# Patient Record
Sex: Female | Born: 1965 | Race: White | Hispanic: No | Marital: Married | Smoking: Current every day smoker
Health system: Southern US, Community
[De-identification: ages and names within clinical notes are randomized; demographics above are authoritative.]

## PROBLEM LIST (undated history)

## (undated) DIAGNOSIS — N644 Mastodynia: Principal | ICD-10-CM

## (undated) DIAGNOSIS — F640 Transsexualism: Secondary | ICD-10-CM

## (undated) DIAGNOSIS — F32A Depression, unspecified: Secondary | ICD-10-CM

## (undated) DIAGNOSIS — F329 Major depressive disorder, single episode, unspecified: Secondary | ICD-10-CM

## (undated) DIAGNOSIS — Z7289 Other problems related to lifestyle: Secondary | ICD-10-CM

## (undated) DIAGNOSIS — Z789 Other specified health status: Secondary | ICD-10-CM

## (undated) DIAGNOSIS — Z8781 Personal history of (healed) traumatic fracture: Secondary | ICD-10-CM

## (undated) DIAGNOSIS — G931 Anoxic brain damage, not elsewhere classified: Secondary | ICD-10-CM

## (undated) DIAGNOSIS — F419 Anxiety disorder, unspecified: Secondary | ICD-10-CM

## (undated) DIAGNOSIS — F319 Bipolar disorder, unspecified: Secondary | ICD-10-CM

## (undated) HISTORY — DX: Personal history of (healed) traumatic fracture: Z87.81

## (undated) HISTORY — DX: Depression, unspecified: F32.A

## (undated) HISTORY — DX: Mastodynia: N64.4

## (undated) HISTORY — PX: TONSILLECTOMY: SUR1361

## (undated) HISTORY — DX: Transsexualism: F64.0

## (undated) HISTORY — DX: Bipolar disorder, unspecified: F31.9

## (undated) HISTORY — DX: Major depressive disorder, single episode, unspecified: F32.9

## (undated) HISTORY — DX: Other problems related to lifestyle: Z72.89

## (undated) HISTORY — PX: MYRINGOTOMY: SUR874

## (undated) HISTORY — DX: Other specified health status: Z78.9

## (undated) HISTORY — PX: BREAST SURGERY: SHX581

## (undated) HISTORY — DX: Anxiety disorder, unspecified: F41.9

## (undated) HISTORY — DX: Anoxic brain damage, not elsewhere classified: G93.1

---
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
## 2011-10-03 DIAGNOSIS — Z8781 Personal history of (healed) traumatic fracture: Secondary | ICD-10-CM

## 2011-10-03 HISTORY — DX: Personal history of (healed) traumatic fracture: Z87.81

## 2014-12-16 ENCOUNTER — Telehealth: Payer: Self-pay | Admitting: Family Medicine

## 2014-12-16 NOTE — Telephone Encounter (Signed)
Called patient and set appt up with Dr Clelia CroftShaw per Dr Clelia CroftShaw

## 2015-01-12 ENCOUNTER — Ambulatory Visit (INDEPENDENT_AMBULATORY_CARE_PROVIDER_SITE_OTHER): Payer: PPO | Admitting: Physician Assistant

## 2015-01-12 ENCOUNTER — Encounter: Payer: Self-pay | Admitting: Family Medicine

## 2015-01-12 VITALS — BP 114/78 | HR 91 | Temp 98.1°F | Resp 16 | Ht 69.0 in | Wt 209.2 lb

## 2015-01-12 DIAGNOSIS — H9313 Tinnitus, bilateral: Secondary | ICD-10-CM

## 2015-01-12 DIAGNOSIS — Z79890 Hormone replacement therapy (postmenopausal): Secondary | ICD-10-CM

## 2015-01-12 DIAGNOSIS — K219 Gastro-esophageal reflux disease without esophagitis: Secondary | ICD-10-CM | POA: Diagnosis not present

## 2015-01-12 DIAGNOSIS — I10 Essential (primary) hypertension: Secondary | ICD-10-CM | POA: Diagnosis not present

## 2015-01-12 DIAGNOSIS — Z6829 Body mass index (BMI) 29.0-29.9, adult: Secondary | ICD-10-CM | POA: Insufficient documentation

## 2015-01-12 DIAGNOSIS — K08109 Complete loss of teeth, unspecified cause, unspecified class: Secondary | ICD-10-CM

## 2015-01-12 DIAGNOSIS — K000 Anodontia: Secondary | ICD-10-CM | POA: Diagnosis not present

## 2015-01-12 DIAGNOSIS — Z1211 Encounter for screening for malignant neoplasm of colon: Secondary | ICD-10-CM

## 2015-01-12 DIAGNOSIS — F17200 Nicotine dependence, unspecified, uncomplicated: Secondary | ICD-10-CM

## 2015-01-12 DIAGNOSIS — G931 Anoxic brain damage, not elsewhere classified: Secondary | ICD-10-CM | POA: Insufficient documentation

## 2015-01-12 DIAGNOSIS — F419 Anxiety disorder, unspecified: Secondary | ICD-10-CM | POA: Diagnosis not present

## 2015-01-12 DIAGNOSIS — N644 Mastodynia: Secondary | ICD-10-CM

## 2015-01-12 DIAGNOSIS — Z6830 Body mass index (BMI) 30.0-30.9, adult: Secondary | ICD-10-CM | POA: Diagnosis not present

## 2015-01-12 DIAGNOSIS — F640 Transsexualism: Secondary | ICD-10-CM | POA: Insufficient documentation

## 2015-01-12 DIAGNOSIS — Z720 Tobacco use: Secondary | ICD-10-CM

## 2015-01-12 DIAGNOSIS — Z7689 Persons encountering health services in other specified circumstances: Secondary | ICD-10-CM

## 2015-01-12 DIAGNOSIS — Z7189 Other specified counseling: Secondary | ICD-10-CM

## 2015-01-12 DIAGNOSIS — F319 Bipolar disorder, unspecified: Secondary | ICD-10-CM | POA: Diagnosis not present

## 2015-01-12 DIAGNOSIS — H9319 Tinnitus, unspecified ear: Secondary | ICD-10-CM | POA: Insufficient documentation

## 2015-01-12 HISTORY — DX: Mastodynia: N64.4

## 2015-01-12 MED ORDER — SPIRONOLACTONE 100 MG PO TABS: 100.0000 mg | ORAL_TABLET | Freq: Two times a day (BID) | ORAL | Status: DC

## 2015-01-12 MED ORDER — LISINOPRIL 10 MG PO TABS: 10.0000 mg | ORAL_TABLET | Freq: Every day | ORAL | Status: DC

## 2015-01-12 MED ORDER — SERTRALINE HCL 100 MG PO TABS: 100.0000 mg | ORAL_TABLET | Freq: Two times a day (BID) | ORAL | Status: DC

## 2015-01-12 MED ORDER — OMEPRAZOLE 40 MG PO CPDR: 40.0000 mg | DELAYED_RELEASE_CAPSULE | Freq: Every day | ORAL | Status: DC

## 2015-01-12 MED ORDER — "SYRINGE 22G X 1-1/2"" 3 ML MISC": Status: DC

## 2015-01-12 MED ORDER — ALPRAZOLAM 2 MG PO TABS: 2.0000 mg | ORAL_TABLET | Freq: Every evening | ORAL | Status: DC | PRN

## 2015-01-12 MED ORDER — ESTRADIOL CYPIONATE 5 MG/ML IM OIL: TOPICAL_OIL | INTRAMUSCULAR | Status: DC

## 2015-01-12 NOTE — Progress Notes (Signed)
Subjective:   Patient ID: Anita Sparks, female     DOB: 03/31/1966, 49 y.o.    MRN: [id number]  PCP: No primary care provider on file.  Chief Complaint  Patient presents with  . esablish care    with Dr. Clelia Croft  . Hernia  . Breast Pain    HPI  Presents to establish care and discuss several issues. She also needs all her medications refilled.  Previously followed by a provider in [place]. Her Engineer, petroleum is in [place]. Her PCP was managing her hormones in consultation with her previous provider in [place], and expert in transgender medicine.  Because Dr. Clelia Croft knows this patient socially, I agreed to see this patient in her place.  She is having LEFT breast pain. She underwent breast augmentation as the first surgery in her transition from female to female. The LEFT implant has developed a scarring "capsule" causing deformity and pain. The surgeon prescribed a three month course of zafirlukast and massage. If ineffective, she understands that surgical repair is the only option. In the two months on this regimen, she's had no improvement.  Prior to Admission medications   Medication Sig Start Date End Date Taking? Authorizing Provider  alprazolam Prudy Feeler) 2 MG tablet Take 1 tablet (2 mg total) by mouth at bedtime as needed for sleep. 01/12/15  Yes Nannette Zill, PA-C  aspirin 81 MG tablet Take 81 mg by mouth daily.   Yes Historical Provider, MD  estradiol cypionate (DEPO-ESTRADIOL) 5 MG/ML injection 2.24 mL IM Q 13 days 01/12/15  Yes Alondra Sahni, PA-C  lisinopril (PRINIVIL,ZESTRIL) 10 MG tablet Take 1 tablet (10 mg total) by mouth daily. 01/12/15  Yes Vint Pola, PA-C  omeprazole (PRILOSEC) 40 MG capsule Take 1 capsule (40 mg total) by mouth daily. 01/12/15  Yes Dnaiel Voller, PA-C  sertraline (ZOLOFT) 100 MG tablet Take 1 tablet (100 mg total) by mouth 2 (two) times daily. 01/12/15  Yes Kavin Weckwerth, PA-C  spironolactone (ALDACTONE) 100 MG tablet Take 1 tablet (100 mg total) by mouth  2 (two) times daily. 01/12/15  Yes Erman Thum, PA-C  zafirlukast (ACCOLATE) 20 MG tablet Take 20 mg by mouth 2 (two) times daily before a meal.   Yes Historical Provider, MD  Syringe/Needle, Disp, (SYRINGE 3CC/22GX1-1/2") 22G X 1-1/2" 3 ML MISC Use as directed 01/12/15   Porfirio Oar, PA-C     Allergies  Allergen Reactions  . Wellbutrin [Bupropion] Palpitations     Patient Active Problem List   Diagnosis Date Noted  . Anoxic brain injury 01/12/2015  . 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 Benign essential HTN 01/12/2015  . GERD (gastroesophageal reflux disease) 01/12/2015  . Breast pain 01/12/2015  . Tinnitus 01/12/2015  . Edentulous 01/12/2015  . Bipolar 1 disorder      Family History  Problem Relation Age of Onset  . Heart disease Father      History   Social History  . Marital Status: Single    Spouse Name: n/a  . Number of Children: 2  . Years of Education: 11th grade   Occupational History  . disabled due to bipolar disorder    Social History Main Topics  . Smoking status: Current Every Day Smoker  . Smokeless tobacco: Not on file  . Alcohol Use: No  . Drug Use: No  . Sexual Activity: Not on file   Other Topics Concern  . Not on file   Social History Narrative   Most of family (mother, sons) disowned her when she began  transitioning from female to female, calling her a freak.   Father was [employer] in [place].   3rd degree [address], [place]        Review of Systems  Constitutional: Negative.   HENT: Positive for rhinorrhea.   Eyes: Negative for visual disturbance.  Respiratory: Positive for cough. Negative for shortness of breath.   Cardiovascular: Negative for chest pain and palpitations.  Gastrointestinal: Negative for nausea, vomiting, diarrhea, constipation and blood in stool.  Endocrine: Negative for cold intolerance, heat intolerance, polydipsia, polyphagia and polyuria.  Genitourinary: Negative for dysuria, urgency, frequency  and hematuria.  Musculoskeletal: Negative for myalgias, back pain and arthralgias.  Skin: Negative for rash and wound.  Allergic/Immunologic: Positive for environmental allergies.  Neurological: Negative for dizziness, weakness and headaches.  Psychiatric/Behavioral: Negative for self-injury and dysphoric mood. The patient is not nervous/anxious.          Objective:  Physical Exam  Constitutional: She appears well-developed and well-nourished. No distress.  BP 114/78 mmHg  Pulse 91  Temp(Src) 98.1 F (36.7 C) (Oral)  Resp 16  Ht  (1.753 m)  Wt 209 lb 3.2 oz (94.892 kg)  BMI 30.88 kg/m2  SpO2 95%   HENT:  Head: Normocephalic and atraumatic.  Right Ear: Hearing, tympanic membrane, external ear and ear canal normal.  Left Ear: Hearing, tympanic membrane, external ear and ear canal normal.  Nose: Mucosal edema present. No rhinorrhea. No epistaxis.  No foreign bodies.  Mouth/Throat: Uvula is midline, oropharynx is clear and moist and mucous membranes are normal. She does not have dentures. Abnormal dentition (fully edentulous). No oropharyngeal exudate.  Eyes: Conjunctivae, EOM and lids are normal. Pupils are equal, round, and reactive to light.  Neck: Full passive range of motion without pain and phonation normal. Neck supple. No thyroid mass and no thyromegaly present.  Cardiovascular: Normal rate, regular rhythm, normal heart sounds and normal pulses.   Pulmonary/Chest: Effort normal and breath sounds normal. Right breast exhibits no inverted nipple, no mass, no nipple discharge, no skin change and no tenderness. Left breast exhibits tenderness. Left breast exhibits no inverted nipple, no mass, no nipple discharge and no skin change. Breasts are asymmetrical.  Bilateral breast implants. LEFT breast is firm, tender, "tight." No erythema. No induration. Surgical scars are well healed.  Abdominal: Normal appearance and bowel sounds are normal. There is no hepatosplenomegaly. There  is no tenderness. Hernia confirmed negative in the ventral area.    Skin: Skin is warm and dry.  Psychiatric: Her speech is normal and behavior is normal. Her mood appears not anxious. Her affect is not angry, not blunt, not labile and not inappropriate. She does not exhibit a depressed mood. She expresses no homicidal and no suicidal ideation.             Assessment & Plan:  1. Encounter to establish care Consent signed for release of previous records. Schedule visit to update health maintenance items in 3 months.  2. Hormone replacement therapy (HRT) Continue current regimen. Will need hormone levels in 3 months. - spironolactone (ALDACTONE) 100 MG tablet; Take 1 tablet (100 mg total) by mouth 2 (two) times daily.  Dispense: 90 tablet; Refill: 3 - estradiol cypionate (DEPO-ESTRADIOL) 5 MG/ML injection; 2.24 mL IM Q 13 days  Dispense: 15 mL; Refill: 3 - Syringe/Needle, Disp, (SYRINGE 3CC/22GX1-1/2") 22G X 1-1/2" 3 ML MISC; Use as directed  Dispense: 50 each; Refill: 0  3. Bipolar 1 disorder Stable on her current regimen.  I will continue this treatment, but if changes are needed, I will refer to psychiatry. - sertraline (ZOLOFT) 100 MG tablet; Take 1 tablet (100 mg total) by mouth 2 (two) times daily.  Dispense: 90 tablet; Refill: 3  4. Anxiety See above. - sertraline (ZOLOFT) 100 MG tablet; Take 1 tablet (100 mg total) by mouth 2 (two) times daily.  Dispense: 90 tablet; Refill: 3 - alprazolam (XANAX) 2 MG tablet; Take 1 tablet (2 mg total) by mouth at bedtime as needed for sleep.  Dispense: 30 tablet; Refill: 0  5. Benign essential HTN Well controlled. Continue current treatment. - lisinopril (PRINIVIL,ZESTRIL) 10 MG tablet; Take 1 tablet (10 mg total) by mouth daily.  Dispense: 90 tablet; Refill: 3  6. Gastroesophageal reflux disease without esophagitis Controlled. Continue current treatment. - omeprazole (PRILOSEC) 40 MG capsule; Take 1 capsule (40 mg total) by mouth daily.   Dispense: 90 capsule; Refill: 3  7. Breast pain She will get the information from her breast surgeon regarding the follow up plan. She is going to need a letter of medical necessity from me in order to have coverage for any surgical intervention. I need the specific diagnosis (adhesive/fibrosing capsulitis?) and planned procedure.  8. Tinnitus, bilateral She'll let me know when she is ready for referral. Presently, she cannot afford specialty co-pays that aren't absolutely necessary.  9. Screening for colon cancer She refuses colonoscopy at present. - POC Hemoccult Bld/Stl (3-Cd Home Screen); Future  10. Edentulous  11. BMI 30.0-30.9,adult Recommend increased exercise and healthy eating choices.  12. Smoker Encouraged smoking cessation.   Fernande Bras, PA-C Physician Assistant-Certified [facility]

## 2015-01-14 ENCOUNTER — Telehealth: Payer: Self-pay | Admitting: Physician Assistant
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
## 2015-01-14 NOTE — Telephone Encounter (Signed)
FYI
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
## 2015-01-16 ENCOUNTER — Telehealth: Payer: Self-pay

## 2015-01-16 NOTE — Telephone Encounter (Signed)
Can we provide this?

## 2015-01-16 NOTE — Telephone Encounter (Signed)
Pt states she would like to have a letter from the Dr stating her cat is like a service animal for her due to her bipolar and it helps with her stress issues Please call [phone]

## 2015-01-17 NOTE — Telephone Encounter (Signed)
chelle- Do you want to advise on this?

## 2015-01-17 NOTE — Telephone Encounter (Signed)
Pt checking on status of this message. Please advise at [phone]

## 2015-01-17 NOTE — Telephone Encounter (Signed)
Pt states she hate to be impatient but her landlord is giving her a hard time, really need the note Please call [phone]

## 2015-01-18 NOTE — Telephone Encounter (Signed)
Unfortunately, it is out of our medical license/capacity to claim this patient's cat is a Metallurgist. Patient can wait to discuss this when Chelle returns from vacation or she can go to a psychiatrist. We cannot provide the referral to psychiatry, she must pursue this on her own. Thank you.

## 2015-01-18 NOTE — Telephone Encounter (Signed)
Patient wants Dr. Clelia Croft to know that the source of her?? Breast pain is called a breast capsule removal. She consulted with a plastic surgeon. She also states that she is bipolar and really needs a letter stating that her cat is a Metallurgist.   [phone]

## 2015-01-18 NOTE — Telephone Encounter (Signed)
I am not involved in this pt's care. Will send to pa pool as well as Chelle who see her may be on vacation.

## 2015-01-19 ENCOUNTER — Ambulatory Visit: Payer: Self-pay | Admitting: Family Medicine

## 2015-01-20 NOTE — Telephone Encounter (Signed)
Called pt, advised message from Dr. Clelia Croft. She was referring to [facility] not Clelia Croft. It sounded like she said Clelia Croft when she said it.

## 2015-01-22 NOTE — Telephone Encounter (Signed)
Does she have any documentation from previous providers regarding the relief provided by the cat?  If so, that would be helpful. If she has signed a lease that states that cats are not allowed, I'm not sure that even a letter from me will help, but I can try.  I need some specifics from her regarding the ways in which the cat helps her.  She was going to get me the details of the letter regarding the breast capsule removal (specificaly what it needs to say).

## 2015-01-23 NOTE — Telephone Encounter (Signed)
She states she will get this information and call me back.

## 2015-01-23 NOTE — Telephone Encounter (Signed)
Letter regarding the cat as a Hospital doctor.  Does she have the information about the letter regarding the breast capsule removal?
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
## 2015-01-24 NOTE — Telephone Encounter (Signed)
Left message on machine to call me back with the information.

## 2015-01-24 NOTE — Telephone Encounter (Signed)
Pt states the information for the breast capsule removal.  Anita Sparks  His assistant is Marian Sorrow  [phone]

## 2015-01-24 NOTE — Telephone Encounter (Signed)
Please call Dr. Mardelle Matte office for advice on what a letter of medical necessity needs to say for this patient's breast capsule removal.

## 2015-01-24 NOTE — Telephone Encounter (Signed)
Spoke with pt, she would like me to mail Rx.

## 2015-01-30 ENCOUNTER — Ambulatory Visit (INDEPENDENT_AMBULATORY_CARE_PROVIDER_SITE_OTHER): Payer: PPO

## 2015-01-30 ENCOUNTER — Ambulatory Visit (INDEPENDENT_AMBULATORY_CARE_PROVIDER_SITE_OTHER): Payer: PPO | Admitting: Family Medicine

## 2015-01-30 VITALS — BP 138/70 | HR 84 | Temp 97.5°F | Resp 16 | Ht 69.0 in | Wt 207.8 lb

## 2015-01-30 DIAGNOSIS — T7491XA Unspecified adult maltreatment, confirmed, initial encounter: Secondary | ICD-10-CM

## 2015-01-30 DIAGNOSIS — R5383 Other fatigue: Secondary | ICD-10-CM

## 2015-01-30 DIAGNOSIS — R059 Cough, unspecified: Secondary | ICD-10-CM

## 2015-01-30 DIAGNOSIS — J189 Pneumonia, unspecified organism: Secondary | ICD-10-CM

## 2015-01-30 DIAGNOSIS — F419 Anxiety disorder, unspecified: Secondary | ICD-10-CM | POA: Diagnosis not present

## 2015-01-30 DIAGNOSIS — R05 Cough: Secondary | ICD-10-CM | POA: Diagnosis not present

## 2015-01-30 DIAGNOSIS — D72829 Elevated white blood cell count, unspecified: Secondary | ICD-10-CM

## 2015-01-30 DIAGNOSIS — F17200 Nicotine dependence, unspecified, uncomplicated: Secondary | ICD-10-CM

## 2015-01-30 DIAGNOSIS — Z720 Tobacco use: Secondary | ICD-10-CM | POA: Diagnosis not present

## 2015-01-30 DIAGNOSIS — Z79890 Hormone replacement therapy (postmenopausal): Secondary | ICD-10-CM | POA: Diagnosis not present

## 2015-01-30 LAB — POCT CBC
GRANULOCYTE PERCENT: 76.3 % (ref 37–80)
HCT, POC: 47.7 % (ref 37.7–47.9)
HEMOGLOBIN: 15 g/dL (ref 12.2–16.2)
Lymph, poc: 3.1 (ref 0.6–3.4)
MCH: 29.3 pg (ref 27–31.2)
MCHC: 31.4 g/dL — AB (ref 31.8–35.4)
MCV: 93 fL (ref 80–97)
MID (cbc): 0.9 (ref 0–0.9)
MPV: 6.3 fL (ref 0–99.8)
POC Granulocyte: 13 — AB (ref 2–6.9)
POC LYMPH PERCENT: 18.5 %L (ref 10–50)
POC MID %: 5.2 %M (ref 0–12)
Platelet Count, POC: 422 10*3/uL (ref 142–424)
RBC: 5.13 M/uL (ref 4.04–5.48)
RDW, POC: 13.9 %
WBC: 17 10*3/uL — AB (ref 4.6–10.2)

## 2015-01-30 MED ORDER — AZITHROMYCIN 250 MG PO TABS: ORAL_TABLET | ORAL | Status: DC

## 2015-01-30 MED ORDER — ALBUTEROL SULFATE HFA 108 (90 BASE) MCG/ACT IN AERS: 2.0000 | INHALATION_SPRAY | RESPIRATORY_TRACT | Status: DC | PRN

## 2015-01-30 MED ORDER — HYDROCOD POLST-CPM POLST ER 10-8 MG/5ML PO SUER: 5.0000 mL | Freq: Two times a day (BID) | ORAL | Status: DC | PRN

## 2015-01-30 MED ORDER — ALPRAZOLAM 1 MG PO TABS: ORAL_TABLET | ORAL | Status: DC
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
## 2015-01-30 NOTE — Patient Instructions (Addendum)
Come back in 1 month to see Chelle to follow up on pneumonia and current social situation. I have refilled your xanax, but otherwise your xanax needs to be managed by Chelle. Use cough syrup at night instead of xanax to help you sleep. Take antibiotic until finished. You may use albuterol inhaler as needed for shortness of breath/coughing fits. If you are not getting better in 7-10 days, return to be seen.  Sulfamethoxazole; Trimethoprim, SMX-TMP tablets What is this medicine? SULFAMETHOXAZOLE; TRIMETHOPRIM or SMX-TMP (suhl fuh meth OK suh zohl; trye METH oh prim) is a combination of a sulfonamide antibiotic and a second antibiotic, trimethoprim. It is used to treat or prevent certain kinds of bacterial infections. It will not work for colds, flu, or other viral infections. This medicine may be used for other purposes; ask your health care provider or pharmacist if you have questions. COMMON BRAND NAME(S): Bacter-Aid DS, Bactrim, Bactrim DS, Septra, Septra DS What should I tell my health care provider before I take this medicine? They need to know if you have any of these conditions: -anemia -asthma -being treated with anticonvulsants -if you frequently drink alcohol containing drinks -kidney disease -liver disease -low level of folic acid or IONGEXB-2-WUXLKGMWNglucose-6-phosphate dehydrogenase -poor nutrition or malabsorption -porphyria -severe allergies -thyroid disorder -an unusual or allergic reaction to sulfamethoxazole, trimethoprim, sulfa drugs, other medicines, foods, dyes, or preservatives -pregnant or trying to get pregnant -breast-feeding How should I use this medicine? Take this medicine by mouth with a full glass of water. Follow the directions on the prescription label. Take your medicine at regular intervals. Do not take it more often than directed. Do not skip doses or stop your medicine early. Talk to your pediatrician regarding the use of this medicine in children. Special care may be  needed. This medicine has been used in children as young as 422 months of age. Overdosage: If you think you have taken too much of this medicine contact a poison control center or emergency room at once. NOTE: This medicine is only for you. Do not share this medicine with others. What if I miss a dose? If you miss a dose, take it as soon as you can. If it is almost time for your next dose, take only that dose. Do not take double or extra doses. What may interact with this medicine? Do not take this medicine with any of the following medications: -aminobenzoate potassium -dofetilide -metronidazole This medicine may also interact with the following medications: -ACE inhibitors like benazepril, enalapril, lisinopril, and ramipril -birth control pills -cyclosporine -digoxin -diuretics -indomethacin -medicines for diabetes -methenamine -methotrexate -phenytoin -potassium supplements -pyrimethamine -sulfinpyrazone -tricyclic antidepressants -warfarin This list may not describe all possible interactions. Give your health care provider a list of all the medicines, herbs, non-prescription drugs, or dietary supplements you use. Also tell them if you smoke, drink alcohol, or use illegal drugs. Some items may interact with your medicine. What should I watch for while using this medicine? Tell your doctor or health care professional if your symptoms do not improve. Drink several glasses of water a day to reduce the risk of kidney problems. Do not treat diarrhea with over the counter products. Contact your doctor if you have diarrhea that lasts more than 2 days or if it is severe and watery. This medicine can make you more sensitive to the sun. Keep out of the sun. If you cannot avoid being in the sun, wear protective clothing and use a sunscreen. Do not use sun lamps or  tanning beds/booths. What side effects may I notice from receiving this medicine? Side effects that you should report to your  doctor or health care professional as soon as possible: -allergic reactions like skin rash or hives, swelling of the face, lips, or tongue -breathing problems -fever or chills, sore throat -irregular heartbeat, chest pain -joint or muscle pain -pain or difficulty passing urine -red pinpoint spots on skin -redness, blistering, peeling or loosening of the skin, including inside the mouth -unusual bleeding or bruising -unusually weak or tired -yellowing of the eyes or skin Side effects that usually do not require medical attention (report to your doctor or health care professional if they continue or are bothersome): -diarrhea -dizziness -headache -loss of appetite -nausea, vomiting -nervousness This list may not describe all possible side effects. Call your doctor for medical advice about side effects. You may report side effects to FDA at 1-800-FDA-1088. Where should I keep my medicine? Keep out of the reach of children. Store at room temperature between 20 to 25 degrees C (68 to 77 degrees F). Protect from light. Throw away any unused medicine after the expiration date. NOTE: This sheet is a summary. It may not cover all possible information. If you have questions about this medicine, talk to your doctor, pharmacist, or health care provider.  2015, Elsevier/Gold Standard. (2013-02-25 14:38:26)

## 2015-02-03 ENCOUNTER — Other Ambulatory Visit: Payer: Self-pay | Admitting: Emergency Medicine

## 2015-02-06 NOTE — Progress Notes (Signed)
The recorded information has been reviewed and considered. Agree with A/P Dr Le   

## 2015-03-02 ENCOUNTER — Other Ambulatory Visit: Payer: Self-pay | Admitting: Physician Assistant

## 2015-03-02 ENCOUNTER — Telehealth: Payer: Self-pay | Admitting: Physician Assistant

## 2015-03-02 DIAGNOSIS — F419 Anxiety disorder, unspecified: Secondary | ICD-10-CM

## 2015-03-02 MED ORDER — ALPRAZOLAM 1 MG PO TABS: ORAL_TABLET | ORAL | Status: DC

## 2015-03-02 NOTE — Telephone Encounter (Signed)
Patient request a refill on Xanax 1 MG. Please call patient when medication is ready for pick up.

## 2015-03-02 NOTE — Telephone Encounter (Signed)
Ready for pick up

## 2015-03-05 ENCOUNTER — Telehealth: Payer: Self-pay

## 2015-03-05 NOTE — Telephone Encounter (Signed)
Patient will be coming into [address] to fill out papers for ROI to [employer].   Papers were left with the clerical TL.   Please be sure every "i" is dotted and every "t" is crossed b/4 the patient leaves.  Any questions ask Medical Records or Chelle.

## 2015-03-05 NOTE — Telephone Encounter (Signed)
Pt picked up Rx.

## 2015-03-06 ENCOUNTER — Encounter: Payer: Self-pay | Admitting: Physician Assistant

## 2015-03-07 ENCOUNTER — Encounter: Payer: Self-pay | Admitting: Physician Assistant

## 2015-03-07 ENCOUNTER — Ambulatory Visit (INDEPENDENT_AMBULATORY_CARE_PROVIDER_SITE_OTHER): Payer: PPO | Admitting: Family Medicine

## 2015-03-07 VITALS — BP 140/74 | HR 83 | Temp 98.3°F | Resp 16 | Ht 69.0 in | Wt 214.8 lb

## 2015-03-07 DIAGNOSIS — Z79890 Hormone replacement therapy (postmenopausal): Secondary | ICD-10-CM | POA: Diagnosis not present

## 2015-03-07 DIAGNOSIS — Z720 Tobacco use: Secondary | ICD-10-CM

## 2015-03-07 DIAGNOSIS — F419 Anxiety disorder, unspecified: Secondary | ICD-10-CM

## 2015-03-07 DIAGNOSIS — F329 Major depressive disorder, single episode, unspecified: Secondary | ICD-10-CM

## 2015-03-07 DIAGNOSIS — F418 Other specified anxiety disorders: Secondary | ICD-10-CM | POA: Diagnosis not present

## 2015-03-07 DIAGNOSIS — I10 Essential (primary) hypertension: Secondary | ICD-10-CM

## 2015-03-07 DIAGNOSIS — F17200 Nicotine dependence, unspecified, uncomplicated: Secondary | ICD-10-CM

## 2015-03-07 DIAGNOSIS — Z113 Encounter for screening for infections with a predominantly sexual mode of transmission: Secondary | ICD-10-CM

## 2015-03-07 DIAGNOSIS — F319 Bipolar disorder, unspecified: Secondary | ICD-10-CM | POA: Diagnosis not present

## 2015-03-07 LAB — CBC WITH DIFFERENTIAL/PLATELET
BASOS PCT: 0 % (ref 0–1)
Basophils Absolute: 0 10*3/uL (ref 0.0–0.1)
EOS ABS: 0.3 10*3/uL (ref 0.0–0.7)
Eosinophils Relative: 2 % (ref 0–5)
HCT: 43.9 % (ref 36.0–46.0)
HEMOGLOBIN: 15.1 g/dL — AB (ref 12.0–15.0)
LYMPHS ABS: 3.6 10*3/uL (ref 0.7–4.0)
Lymphocytes Relative: 24 % (ref 12–46)
MCH: 31.8 pg (ref 26.0–34.0)
MCHC: 34.4 g/dL (ref 30.0–36.0)
MCV: 92.4 fL (ref 78.0–100.0)
MONO ABS: 0.7 10*3/uL (ref 0.1–1.0)
MPV: 9.4 fL (ref 8.6–12.4)
Monocytes Relative: 5 % (ref 3–12)
NEUTROS ABS: 10.2 10*3/uL — AB (ref 1.7–7.7)
NEUTROS PCT: 69 % (ref 43–77)
Platelets: 317 10*3/uL (ref 150–400)
RBC: 4.75 MIL/uL (ref 3.87–5.11)
RDW: 14.5 % (ref 11.5–15.5)
WBC: 14.8 10*3/uL — ABNORMAL HIGH (ref 4.0–10.5)

## 2015-03-07 LAB — COMPREHENSIVE METABOLIC PANEL
ALK PHOS: 69 U/L (ref 33–130)
ALT: 9 U/L (ref 6–29)
AST: 11 U/L (ref 10–35)
Albumin: 3.9 g/dL (ref 3.6–5.1)
BILIRUBIN TOTAL: 0.4 mg/dL (ref 0.2–1.2)
BUN: 10 mg/dL (ref 7–25)
CALCIUM: 8.8 mg/dL (ref 8.6–10.4)
CO2: 23 mmol/L (ref 20–31)
Chloride: 103 mmol/L (ref 98–110)
Creat: 0.83 mg/dL (ref 0.50–1.05)
Glucose, Bld: 76 mg/dL (ref 65–99)
POTASSIUM: 4.4 mmol/L (ref 3.5–5.3)
SODIUM: 137 mmol/L (ref 135–146)
TOTAL PROTEIN: 6.6 g/dL (ref 6.1–8.1)

## 2015-03-07 LAB — HIV ANTIBODY (ROUTINE TESTING W REFLEX): HIV 1&2 Ab, 4th Generation: NONREACTIVE

## 2015-03-07 MED ORDER — VENLAFAXINE HCL ER 37.5 MG PO CP24: ORAL_CAPSULE | ORAL | Status: DC

## 2015-03-07 NOTE — Patient Instructions (Addendum)
Contact Calton Dach [phone] to schedule for psychotherapy.   Keep working on smoking cessation!  Did you know that you begin to benefit from quitting smoking within the first twenty minutes?  It's TRUE.  At 20 minutes: -blood pressure decreases -pulse rate drops -body temperature of hands and feet increases  At 8 hours: -carbon monoxide level in blood drops to normal -oxygen level in blood increases to normal  At 24 hours: -the chance of heart attack decreases  At 48 hours: -nerve endings start regrowing -ability to smell and taste is enhanced  2 weeks-3 months: -circulation improves -walking becomes easier -lung function improves  1-9 months: -coughing, sinus congestion, fatigue and shortness of breath decreases  1 year: -excess risk of heart disease is decreased to HALF that of a smoker  5 years: Stroke risk is reduced to that of people who have never smoked  10 years: -risk of lung cancer drops to as little as half that of continuing smokers -risk of cancer of the mouth, throat, esophagus, bladder, kidney and pancreas decreases -risk of ulcer decreases  15 years -risk of heart disease is now similar to that of people who have never smoked -risk of death returns to nearly the level of people who have never smoked
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
## 2015-03-08 ENCOUNTER — Telehealth: Payer: Self-pay | Admitting: Family Medicine

## 2015-03-08 ENCOUNTER — Telehealth: Payer: Self-pay

## 2015-03-08 DIAGNOSIS — R768 Other specified abnormal immunological findings in serum: Secondary | ICD-10-CM | POA: Insufficient documentation

## 2015-03-08 DIAGNOSIS — F649 Gender identity disorder, unspecified: Secondary | ICD-10-CM

## 2015-03-08 DIAGNOSIS — Z789 Other specified health status: Secondary | ICD-10-CM

## 2015-03-08 DIAGNOSIS — F640 Transsexualism: Secondary | ICD-10-CM

## 2015-03-08 LAB — HSV(HERPES SIMPLEX VRS) I + II AB-IGG: HSV 1 Glycoprotein G Ab, IgG: <0.1 IV

## 2015-03-08 LAB — GC/CHLAMYDIA PROBE AMP
CT Probe RNA: NEGATIVE
GC PROBE AMP APTIMA: NEGATIVE

## 2015-03-08 LAB — RPR

## 2015-03-08 LAB — VITAMIN D 25 HYDROXY (VIT D DEFICIENCY, FRACTURES): Vit D, 25-Hydroxy: 21 ng/mL — ABNORMAL LOW (ref 30–100)

## 2015-03-08 LAB — HEPATITIS B SURFACE ANTIBODY, QUANTITATIVE: HEPATITIS B-POST: 311 m[IU]/mL

## 2015-03-08 LAB — HEPATITIS C ANTIBODY: HCV AB: REACTIVE — AB

## 2015-03-08 NOTE — Telephone Encounter (Signed)
Patient is really concerned about the Hep C test have call us several times want to speak with a provider or a nurse she states that she had it retest when she was admitted for suicide she has taken her Xanax and doesn't want to go into a panic attack please respond quickly

## 2015-03-08 NOTE — Telephone Encounter (Signed)
Spoke with patient regarding her lab results. I was unaware of her previous Hep C infection and treatment. Discussed positive antibody test vs active infection. Reflex testing already initiated and pending. With this history, I expect the reflex testing to show clearance of the infection.  She is also concerned that her testosterone level is elevated compared to usual (reports it should be <10, now 35).  I left a message for Nurse Case Manager, Loraine Leriche, at [facility] regarding recommendations for monitoring and adjusting HRT regimen.  Also, contacted Dr. [facility] regarding labs for upcoming surgery (04/16/2015). Will place future orders so that patient can have them drawn before her surgery.  Will need CBC with diff, CMP, PT/pTT, EKG, vitals and medical clearance letter.  The patient believes these need to be performed ON 03/16/2015, though I suspect that they need to be performed NOT EARLIER than 8/12 to be valid for her surgery. As such, the EKG and labs performed here yesterday will have to be repeated. Waiting on clarification of the issue.

## 2015-03-08 NOTE — Telephone Encounter (Signed)
Pt notified it is okay to have labs drawn on 8/15. Will come in evening of 03/19/15 to have labs and EKG done for surgical clearance.  Okay to have labs and EKG done after 03/16/15 per Marian Sorrow from Dr. Marcial Pacas Alexander's office.

## 2015-03-08 NOTE — Telephone Encounter (Signed)
Spoke to [place]ters at [facility] [employer]. Labs: CBC w/ diff CMP PT and PTT  EKG Vital signs Medical Clearance letter

## 2015-03-09 LAB — ESTRADIOL: Estradiol: 290.5 pg/mL

## 2015-03-09 LAB — TESTOSTERONE: Testosterone: 35 ng/dL (ref 10–70)

## 2015-03-10 NOTE — Telephone Encounter (Signed)
She will need to see a provider to be able to have a surgical clearance.

## 2015-03-12 NOTE — Telephone Encounter (Signed)
Pt will be in on 09/15

## 2015-03-12 NOTE — Telephone Encounter (Signed)
Tried to call pt, she answered and phone went silent.

## 2015-03-13 LAB — HEPATITIS C RNA QUANTITATIVE: HCV Quantitative: NOT DETECTED IU/mL (ref <15)

## 2015-03-19 ENCOUNTER — Ambulatory Visit (INDEPENDENT_AMBULATORY_CARE_PROVIDER_SITE_OTHER): Payer: PPO | Admitting: Physician Assistant

## 2015-03-19 ENCOUNTER — Telehealth: Payer: Self-pay

## 2015-03-19 VITALS — BP 128/80 | HR 100 | Temp 98.0°F | Resp 20 | Ht 69.0 in | Wt 218.2 lb

## 2015-03-19 DIAGNOSIS — F329 Major depressive disorder, single episode, unspecified: Secondary | ICD-10-CM

## 2015-03-19 DIAGNOSIS — Z01818 Encounter for other preprocedural examination: Secondary | ICD-10-CM

## 2015-03-19 DIAGNOSIS — F418 Other specified anxiety disorders: Secondary | ICD-10-CM | POA: Diagnosis not present

## 2015-03-19 DIAGNOSIS — F649 Gender identity disorder, unspecified: Secondary | ICD-10-CM | POA: Diagnosis not present

## 2015-03-19 DIAGNOSIS — F419 Anxiety disorder, unspecified: Secondary | ICD-10-CM

## 2015-03-19 MED ORDER — ALPRAZOLAM 1 MG PO TABS: 1.0000 mg | ORAL_TABLET | Freq: Three times a day (TID) | ORAL | Status: DC | PRN

## 2015-03-19 NOTE — Telephone Encounter (Signed)
Pt thought she had a pre surgical appt with chelle today as an appt bu t we have it on the books for 04/19/15 at 1130 she would like to confirm with chelle that she can come in at 6 and get everything done so that it can be sent to the plastic surgeon   Best number [phone]

## 2015-03-19 NOTE — Telephone Encounter (Signed)
Patient states the lab work with Theora Gianotti today but appt was in as 04/19/15. Their if no future lab orders in for patient to just come in an be fast track. Patient has to have labs done today to be sent to his plastic surgeon. Per patient chelle know all about this. Patient request someone to contact chelle to see if she can put in the lab orders so patient can come asap today. Any questions please call patient at [phone]

## 2015-03-19 NOTE — Telephone Encounter (Signed)
I believe so. Please advise.

## 2015-03-19 NOTE — Progress Notes (Signed)
Patient ID: Anita Sparks, female    DOB: [birth year], 49 y.o.   MRN: [id number]  PCP: Aubreigh Fuerte, PA-C  Subjective:   Chief Complaint  Patient presents with  . Anxiety    Needs increased dose of Xanax  . Medical Clearance    Needs EKG & labs    HPI Presents for surgical clearance.  Will travel to [place] for facial surgery as she continues her transition female to female. Breast augmentation has been previously performed. She continues on estrogen and spironolactone.  In addition, has been taking alprazolam TID most days, and needs a new prescription that reflects that.   Review of Systems  Constitutional: Negative for fever, chills and fatigue.  HENT: Negative for congestion, ear pain, postnasal drip, rhinorrhea and sore throat.   Eyes: Negative for visual disturbance.  Respiratory: Negative for cough, chest tightness, shortness of breath and wheezing.   Cardiovascular: Negative for chest pain, palpitations and leg swelling.  Gastrointestinal: Negative for nausea, vomiting, diarrhea and constipation.  Genitourinary: Negative for dysuria, urgency and frequency.  Musculoskeletal: Negative for myalgias and arthralgias.  Skin: Negative for rash.  Neurological: Negative for headaches.  Hematological: Negative for adenopathy.  Psychiatric/Behavioral: Negative for suicidal ideas and self-injury. The patient is nervous/anxious.        Patient Active Problem List   Diagnosis Date Noted  . Hepatitis C antibody test positive 03/08/2015  . 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 Gender identity disorder 03/08/2015  . Gender dysphoria 03/08/2015  . Hormone replacement therapy (HRT) 03/07/2015  . Leukocytosis 01/30/2015  . Anoxic brain injury 01/12/2015  . 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 Benign essential HTN 01/12/2015  . GERD (gastroesophageal reflux disease) 01/12/2015  . Breast pain 01/12/2015  . Tinnitus 01/12/2015  . Edentulous 01/12/2015  . BMI 30.0-30.9,adult 01/12/2015  .  Smoker 01/12/2015  . Bipolar 1 disorder      Prior to Admission medications   Medication Sig Start Date End Date Taking? Authorizing Provider  ALPRAZolam Prudy Feeler) 1 MG tablet Take 1 tablet po BID for anxiety. May take 1 additional tab QD prn panic attack. 03/02/15  Yes Lanier Clam V, PA-C  aspirin 81 MG tablet Take 81 mg by mouth daily.   Yes Historical Provider, MD  estradiol cypionate (DEPO-ESTRADIOL) 5 MG/ML injection 2.24 mL IM Q 13 days 01/12/15  Yes Nicolae Vasek, PA-C  lisinopril (PRINIVIL,ZESTRIL) 10 MG tablet Take 1 tablet (10 mg total) by mouth daily. 01/12/15  Yes Gladys Deckard, PA-C  omeprazole (PRILOSEC) 40 MG capsule Take 1 capsule (40 mg total) by mouth daily. 01/12/15  Yes Ridhima Golberg, PA-C  spironolactone (ALDACTONE) 100 MG tablet Take 1 tablet (100 mg total) by mouth 2 (two) times daily. 01/12/15  Yes Daneesha Quinteros, PA-C  Syringe/Needle, Disp, (SYRINGE 3CC/22GX1-1/2") 22G X 1-1/2" 3 ML MISC Use as directed 01/12/15  Yes Megann Easterwood, PA-C  venlafaxine XR (EFFEXOR-XR) 37.5 MG 24 hr capsule Take one PO QD x 1 week, then 2 PO QD. 03/07/15  Yes Shriya Aker, PA-C  zafirlukast (ACCOLATE) 20 MG tablet Take 20 mg by mouth 2 (two) times daily before a meal.    Historical Provider, MD     Allergies  Allergen Reactions  . Wellbutrin [Bupropion] Palpitations       Objective:  Physical Exam  Constitutional: She is oriented to person, place, and time. Vital signs are normal. She appears well-developed and well-nourished. She is active and cooperative. No distress.  BP 128/80 mmHg  Pulse 100  Temp(Src) 98 F (36.7  C) (Oral)  Resp 20  Ht 5\' 9"  (1.753 m)  Wt 218 lb 4 oz (98.998 kg)  BMI 32.22 kg/m2  HENT:  Head: Normocephalic and atraumatic.  Right Ear: Hearing normal.  Left Ear: Hearing normal.  Eyes: Conjunctivae are normal. No scleral icterus.  Neck: Normal range of motion. Neck supple. No thyromegaly present.  Cardiovascular: Normal rate, regular rhythm and normal  heart sounds.   Pulses:      Radial pulses are 2+ on the right side, and 2+ on the left side.  Pulmonary/Chest: Effort normal and breath sounds normal.  Lymphadenopathy:       Head (right side): No tonsillar, no preauricular, no posterior auricular and no occipital adenopathy present.       Head (left side): No tonsillar, no preauricular, no posterior auricular and no occipital adenopathy present.    She has no cervical adenopathy.       Right: No supraclavicular adenopathy present.       Left: No supraclavicular adenopathy present.  Neurological: She is alert and oriented to person, place, and time. No sensory deficit.  Skin: Skin is warm, dry and intact. No rash noted. No cyanosis or erythema. Nails show no clubbing.  Psychiatric: She has a normal mood and affect. Her speech is normal and behavior is normal. Cognition and memory are not impaired. She expresses no homicidal and no suicidal ideation.   EKG: reviewed with Dr. Alwyn Ren. NSR. No ischemia or LVH. No abnormal rhythm.     Assessment & Plan:   1. Anxiety and depression Increase alprazolam to TID PRN. - ALPRAZolam (XANAX) 1 MG tablet; Take 1 tablet (1 mg total) by mouth 3 (three) times daily as needed for anxiety.  Dispense: 90 tablet; Refill: 0  2. Gender dysphoria Continue current treatment and proceed with plans for additional elements of transition.  3. Pre-operative clearance Low risk. Await lab results. Normal EKG. Will fax to surgeon. - CBC with Differential/Platelet - Comprehensive metabolic panel - EKG 12-Lead - APTT - Protime-INR   Fernande Bras, PA-C Physician Assistant-Certified [facility]

## 2015-03-20 LAB — CBC WITH DIFFERENTIAL/PLATELET
Basophils Absolute: 0 10*3/uL (ref 0.0–0.1)
Basophils Relative: 0 % (ref 0–1)
Eosinophils Absolute: 0.2 10*3/uL (ref 0.0–0.7)
Eosinophils Relative: 1 % (ref 0–5)
HEMATOCRIT: 43.9 % (ref 36.0–46.0)
HEMOGLOBIN: 15 g/dL (ref 12.0–15.0)
LYMPHS ABS: 3.8 10*3/uL (ref 0.7–4.0)
Lymphocytes Relative: 22 % (ref 12–46)
MCH: 31.6 pg (ref 26.0–34.0)
MCHC: 34.2 g/dL (ref 30.0–36.0)
MCV: 92.4 fL (ref 78.0–100.0)
MPV: 8.9 fL (ref 8.6–12.4)
Monocytes Absolute: 0.7 10*3/uL (ref 0.1–1.0)
Monocytes Relative: 4 % (ref 3–12)
NEUTROS ABS: 12.7 10*3/uL — AB (ref 1.7–7.7)
NEUTROS PCT: 73 % (ref 43–77)
Platelets: 343 10*3/uL (ref 150–400)
RBC: 4.75 MIL/uL (ref 3.87–5.11)
RDW: 14.2 % (ref 11.5–15.5)
WBC: 17.4 10*3/uL — ABNORMAL HIGH (ref 4.0–10.5)

## 2015-03-20 LAB — COMPREHENSIVE METABOLIC PANEL
ALK PHOS: 69 U/L (ref 33–130)
ALT: 11 U/L (ref 6–29)
AST: 12 U/L (ref 10–35)
Albumin: 4 g/dL (ref 3.6–5.1)
BUN: 11 mg/dL (ref 7–25)
CO2: 24 mmol/L (ref 20–31)
Calcium: 9.1 mg/dL (ref 8.6–10.4)
Chloride: 102 mmol/L (ref 98–110)
Creat: 0.81 mg/dL (ref 0.50–1.05)
Glucose, Bld: 79 mg/dL (ref 65–99)
POTASSIUM: 4.2 mmol/L (ref 3.5–5.3)
Sodium: 136 mmol/L (ref 135–146)
TOTAL PROTEIN: 6.9 g/dL (ref 6.1–8.1)
Total Bilirubin: 0.3 mg/dL (ref 0.2–1.2)

## 2015-03-20 LAB — PROTIME-INR
INR: 1.05 (ref <1.50)
Prothrombin Time: 13.7 seconds (ref 11.6–15.2)

## 2015-03-20 LAB — APTT: aPTT: 33.5 seconds (ref 24–37)

## 2015-03-22 ENCOUNTER — Ambulatory Visit: Payer: PPO | Admitting: Physician Assistant

## 2015-03-22 ENCOUNTER — Telehealth: Payer: Self-pay | Admitting: Physician Assistant

## 2015-03-22 DIAGNOSIS — F640 Transsexualism: Secondary | ICD-10-CM

## 2015-03-22 NOTE — Telephone Encounter (Signed)
1. Please fax patient's consent to Psychiatry, [facility]. These items are in the box at the nurses' desk.  2. Please print LABS and EKG from most recent visit. Fax, along with my letter (same date, also at the nurses' desk) to her plastic surgeon: Dr. Jasmine December in [place]. [place], [place]. His assistant's name is Marian Sorrow, and the phone number there is [phone].

## 2015-03-22 NOTE — Telephone Encounter (Signed)
Faxed all information to her plastic surgeon and also faxed the release for her psychiatrist. Called pt to let her know and left a detailed message.

## 2015-03-23 ENCOUNTER — Telehealth: Payer: Self-pay | Admitting: Family Medicine

## 2015-03-23 ENCOUNTER — Encounter: Payer: Self-pay | Admitting: *Deleted

## 2015-03-23 ENCOUNTER — Other Ambulatory Visit: Payer: PPO | Admitting: *Deleted

## 2015-03-23 NOTE — Telephone Encounter (Signed)
i called patient about her labs she states that she was coming into office today for lab draw sheketia is working on her paper work to be faxed it look like maybe Delaney Meigs already fax but i will call the office that's out of town to see if they had received it

## 2015-03-26 ENCOUNTER — Telehealth: Payer: Self-pay

## 2015-03-26 NOTE — Telephone Encounter (Signed)
Patient would like to know if the form has been faxed

## 2015-03-26 NOTE — Telephone Encounter (Signed)
Patient called in tonight stating she was waiting on a call back about re faxing her lab work to her Engineer, petroleum. There is no documentation about this can someone please call her and let her know what is going on here and make sure that her plastic surgeon got her BMP results.  Her call back number is [phone]

## 2015-03-27 ENCOUNTER — Other Ambulatory Visit: Payer: Self-pay | Admitting: Physician Assistant

## 2015-03-27 ENCOUNTER — Telehealth: Payer: Self-pay

## 2015-03-27 MED ORDER — ONDANSETRON 4 MG PO TBDP: 4.0000 mg | ORAL_TABLET | Freq: Three times a day (TID) | ORAL | Status: DC | PRN

## 2015-03-27 NOTE — Telephone Encounter (Signed)
Pt called saying she has to have pre-authorization for her medication.  Please call [phone]

## 2015-03-27 NOTE — Telephone Encounter (Signed)
Spoke with pt, I advised her that we sent all her labs, EKG, and clearance letter to her plastic surgeon. I called Marian Sorrow to confirm. Left message to call back. Her number is [phone].

## 2015-03-28 NOTE — Telephone Encounter (Signed)
Looks like this has been done:  Notes Recorded by Porfirio Oar, PA-C on 03/22/2015 at 3:30 PM Please call this patient. Her labs are normal except the WBC, which is stable. These have been faxed, with my letter, to her surgeon.

## 2015-03-29 NOTE — Telephone Encounter (Signed)
Called pt and she stated that the PA is needed for zofran ODT, and that she is having SE of nausea from the new anti-depressant she was Rxd. She wants me to try to get the total Rx instead of a reduced amount that ins may approve immediately. Called pharm and they reported that the Rx does not need a PA, it went through for $1.94 co-pay. Called pt back and advised.

## 2015-04-03 NOTE — Progress Notes (Signed)
History and physical examinations reviewed in detail with PA Leotis Shames.  Agree with assessment and plan.   Anita Sparks, M.D. [facility] [address] [place], [place]  [address] [phone] phone [phone] fax

## 2015-04-11 ENCOUNTER — Telehealth: Payer: Self-pay | Admitting: Family Medicine

## 2015-04-11 ENCOUNTER — Telehealth: Payer: Self-pay

## 2015-04-11 DIAGNOSIS — F419 Anxiety disorder, unspecified: Secondary | ICD-10-CM

## 2015-04-11 MED ORDER — ALPRAZOLAM 1 MG PO TABS: 1.0000 mg | ORAL_TABLET | Freq: Four times a day (QID) | ORAL | Status: DC

## 2015-04-11 MED ORDER — ALPRAZOLAM 1 MG PO TABS: 1.0000 mg | ORAL_TABLET | Freq: Three times a day (TID) | ORAL | Status: DC | PRN

## 2015-04-11 NOTE — Telephone Encounter (Signed)
1. I am happy to prescribe medications for pain short term following her surgery, but I will need written instructions from the surgeon that includes how long he expects her to need the pain medication.  2. I have printed a prescription for alprazolam (ok to call/fax it in) with instructions to fill early due to her travels. It is a 30-day supply. I cannot imagine that weather will keep her in [place] for longer than the 30-day supply will last her. If her stay is extended that long, she may need to seek care from a provider where she is, if her surgeon is not able to prescribe her maintenance alprazolam.

## 2015-04-11 NOTE — Telephone Encounter (Signed)
PATIENT WOULD LIKE CHELLE TO HELP HER.Francis Dowse STATES CHELLE KNOWS THAT SHE IS HAVING PLASTIC SURGERY IN [place]. FIRST, SHE WILL HAVE PROBLEMS WITH HER PRESCRIPTIONS FOR PAIN. [place] WILL FILL THEM, BUT [place] WILL NOT. AFTER SURGERY WHEN SHE COMES BACK HOME, SHE WANTS TO KNOW IF CHELLE CAN RE-WRITE THEM FOR [facility] IN [place]? SECOND, SHE WILL RUN OUT OF HER XANAX 3 MG WHILE SHE IS THERE. [place] WILL NOT EXCEPT A PRESCRIPTION FOR IT. SHE WOULD LIKE TO KNOW IF CHELLE WILL GIVE HER AT LEAST A 2 MONTH SUPPLY SO SHE WON'T RUN OUT? SHE WILL BE IN [place] FOR 11 DAYS, BUT MAY NEED TO BE THERE LONGER IF THE WEATHER IS BAD. BEST PHONE [phone]  (CELL)  PHARMACY CHOICE IS [facility] ON [address]. MBC

## 2015-04-11 NOTE — Telephone Encounter (Signed)
Spoke with pt, advised message from Chelle. 

## 2015-04-11 NOTE — Telephone Encounter (Signed)
Spoke with patient; leaving for [place] tomorrow morning.  Filled Xanax on 03/27/15; the state of [place] will not honor rx from [facility].  Same applies to narcotics.  Cannot go without Xanax due to severity of bipolar.  Will be in [place] for one month.  Has paid for hotel reservations and plane reservations.  [facility] refused to fill rx early; went to [facility] but could not do it unless dose was increased or changed.  Both pharmacies refused to fill rx early despite provider requesting an early fill.  Reported that cannot fill until 04-19-15.   Approved early fill of rx due to travel; changed the rx for one month only for qid so that pharmacy will fill early.  Rx is ready for pick up.  To PA [facility] for FYI.

## 2015-04-11 NOTE — Telephone Encounter (Signed)
Rx faxed

## 2015-04-11 NOTE — Telephone Encounter (Signed)
Pt notified, Rx was written for travel purposes only.

## 2015-04-16 NOTE — Telephone Encounter (Signed)
There is another message thread regarding the controlled substance prescription.  Ultimately, Dr. Katrinka Blazing changed it so that it could be filled before she left for [place].  Generally, it's the SURGEON who submits the information to the insurance company to get the procedure covered.  I believe that she is having facial surgery, not breast augmentation, but she may be having a procedure to deal with capsulitis of one of her breast implants.

## 2015-04-16 NOTE — Telephone Encounter (Signed)
Patient is having a lot of trouble getting her surgery approved by insurance company (Medicare). She states that she is having breast augmentation and her insurance company will not cover this because it is elective plastic surgery however patient states that she is transgender and they should cover this surgery for her. I am not sure what Chelle will be able to do in this case however patient wants this information communicated to Chelle. Patient states that she is also having issues with her prescriptions. She is currently in [place]. [place], [place]. States that [place] has stricter laws regarding narcotics than [place].   [phone]

## 2015-04-17 ENCOUNTER — Ambulatory Visit: Payer: PPO | Admitting: Physician Assistant

## 2015-04-17 HISTORY — PX: FACIAL RECONSTRUCTION SURGERY: SHX631

## 2015-04-17 NOTE — Telephone Encounter (Signed)
Lm message to call back for follow up on message left

## 2015-04-19 ENCOUNTER — Ambulatory Visit: Payer: PPO | Admitting: Physician Assistant

## 2015-05-01 ENCOUNTER — Ambulatory Visit (INDEPENDENT_AMBULATORY_CARE_PROVIDER_SITE_OTHER): Payer: PPO | Admitting: Physician Assistant

## 2015-05-01 VITALS — BP 112/76 | HR 86 | Temp 98.2°F | Resp 18 | Ht 69.0 in | Wt 205.6 lb

## 2015-05-01 DIAGNOSIS — Z4802 Encounter for removal of sutures: Secondary | ICD-10-CM | POA: Diagnosis not present

## 2015-05-01 DIAGNOSIS — R519 Headache, unspecified: Secondary | ICD-10-CM

## 2015-05-01 DIAGNOSIS — R51 Headache: Secondary | ICD-10-CM

## 2015-05-01 DIAGNOSIS — F649 Gender identity disorder, unspecified: Secondary | ICD-10-CM | POA: Diagnosis not present

## 2015-05-01 DIAGNOSIS — Z9889 Other specified postprocedural states: Secondary | ICD-10-CM

## 2015-05-01 MED ORDER — OXYCODONE-ACETAMINOPHEN 7.5-325 MG PO TABS: 1.0000 | ORAL_TABLET | ORAL | Status: DC | PRN

## 2015-05-01 NOTE — Progress Notes (Signed)
Anita Sparks  MRN: [id number] DOB: [birth year]  Subjective:  Pt presents to clinic for suture removal.  She had a face lift with nose and lip augmentation on 9/13 in [place].  Her face stitches were removed during her 11 day stay after the surgery but the sutures in her scalp need to be removed today.  She has been following directions of daily hair washing and then putting on abx ointment onto the incision areas.  She is still having quite a bit of pain and she has been using motrin and her percocet as instructed - she was told she would have to get a refill here at her f/u because of over state lines and controlled substances.  She has mainly been resting and recovering since she got home several days ago.  Patient Active Problem List   Diagnosis Date Noted  . Hepatitis C antibody test positive 03/08/2015  . 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 Gender identity disorder 03/08/2015  . Gender dysphoria 03/08/2015  . Hormone replacement therapy (HRT) 03/07/2015  . Leukocytosis 01/30/2015  . Anoxic brain injury 01/12/2015  . 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 Benign essential HTN 01/12/2015  . GERD (gastroesophageal reflux disease) 01/12/2015  . Breast pain 01/12/2015  . Tinnitus 01/12/2015  . Edentulous 01/12/2015  . BMI 30.0-30.9,adult 01/12/2015  . Smoker 01/12/2015  . Bipolar 1 disorder     Current Outpatient Prescriptions on File Prior to Visit  Medication Sig Dispense Refill  . ALPRAZolam (XANAX) 1 MG tablet Take 1 tablet (1 mg total) by mouth 4 (four) times daily. 90 tablet 0  . aspirin 81 MG tablet Take 81 mg by mouth daily.    Marland Kitchen estradiol cypionate (DEPO-ESTRADIOL) 5 MG/ML injection 2.24 mL IM Q 13 days 15 mL 3  . lisinopril (PRINIVIL,ZESTRIL) 10 MG tablet Take 1 tablet (10 mg total) by mouth daily. 90 tablet 3  . omeprazole (PRILOSEC) 40 MG capsule Take 1 capsule (40 mg total) by mouth daily. 90 capsule 3  . ondansetron (ZOFRAN-ODT) 4 MG disintegrating tablet Take 1-2 tablets (4-8 mg  total) by mouth every 8 (eight) hours as needed for nausea or vomiting. 60 tablet 0  . spironolactone (ALDACTONE) 100 MG tablet Take 1 tablet (100 mg total) by mouth 2 (two) times daily. 90 tablet 3  . Syringe/Needle, Disp, (SYRINGE 3CC/22GX1-1/2") 22G X 1-1/2" 3 ML MISC Use as directed 50 each 0  . venlafaxine XR (EFFEXOR-XR) 37.5 MG 24 hr capsule Take one PO QD x 1 week, then 2 PO QD. 60 capsule 2  . zafirlukast (ACCOLATE) 20 MG tablet Take 20 mg by mouth 2 (two) times daily before a meal.     No current facility-administered medications on file prior to visit.    Allergies  Allergen Reactions  . Wellbutrin [Bupropion] Palpitations    Review of Systems  Constitutional: Negative for fever and chills.  Skin: Positive for wound.   Objective:  BP 112/76 mmHg  Pulse 86  Temp(Src) 98.2 F (36.8 C) (Oral)  Resp 18  Ht  (1.753 m)  Wt 205 lb 9.6 oz (93.26 kg)  BMI 30.35 kg/m2  SpO2 92%  Physical Exam  Constitutional: She is oriented to person, place, and time and well-developed, well-nourished, and in no distress.  HENT:  Head: Normocephalic.    Right Ear: Hearing and external ear normal.  Left Ear: Hearing and external ear normal.  Bruising and swelling along the incision lines.  Dried blood in ear canal - removed without difficulty  Eyes: Conjunctivae are normal.  Neck: Normal range of motion.  Pulmonary/Chest: Effort normal.  Neurological: She is alert and oriented to person, place, and time. Gait normal.  Skin: Skin is warm and dry.  Psychiatric: Mood, memory, affect and judgment normal.  Vitals reviewed.   Assessment and Plan :  Face pain - Plan: oxyCODONE-acetaminophen (PERCOCET) 7.5-325 MG tablet  Gender identity disorder  S/P reconstruction procedure   Stitches removed and the incisions for the most part are healing well.  We talked about the right ear not as well healed as the left side - she is to continue to rest and be careful of manipulation of the  right ear due to incision not healed as well on that side.  There is no sign of infection at this time and she will f/u if she has any further problems.  Benny Lennert PA-C  [facility] 05/01/2015 1:20 PM

## 2015-05-09 ENCOUNTER — Other Ambulatory Visit: Payer: Self-pay

## 2015-05-09 ENCOUNTER — Other Ambulatory Visit: Payer: Self-pay | Admitting: Urgent Care

## 2015-05-09 DIAGNOSIS — R51 Headache: Principal | ICD-10-CM

## 2015-05-09 DIAGNOSIS — R519 Headache, unspecified: Secondary | ICD-10-CM

## 2015-05-09 MED ORDER — OXYCODONE-ACETAMINOPHEN 7.5-325 MG PO TABS: 1.0000 | ORAL_TABLET | ORAL | Status: DC | PRN

## 2015-05-09 NOTE — Telephone Encounter (Signed)
Called the surgeon's assistant Marian Sorrow. I was unable to leave a VM at [phone] due to full mailbox. The patient was supposed to retrieve information from her surgeon regarding dosing of her pain medication; it is unclear if this has happened. I will refill for one week only as per PA-Chelle's instructions. I will discuss this with the patient as well when the patient arrives for the prescription.

## 2015-05-09 NOTE — Telephone Encounter (Signed)
Pt would like a script of her oxyCODONE-acetaminophen (PERCOCET) 7.5-325 MG tablet [[id number]]. Please call her first to find out where she would like it sent. She had a difficult time getting it filled last time and she wasn't sure where she would like to pick it up. She is having difficulty eating because of her facial reconstruction-it's very painful for her to eat at this time. Please advise at [phone]

## 2015-05-09 NOTE — Progress Notes (Signed)
Patient states that she discuss her pain management with her surgeon from [place]. Per patient she is to have one more refill and this should be sufficient for management of this pain post surgery. I discussed potential for risks and side effects with patient. She verbalized understanding. One refill provided any further refills will need to be improved by her surgeon directly.

## 2015-05-09 NOTE — Telephone Encounter (Signed)
I will discuss this with patient when the patient arrives to clinic. Per PA-Chelle, the patient is supposed to have information from the surgeon from [place] regarding dosing.

## 2015-05-09 NOTE — Telephone Encounter (Signed)
Pt called back and told operator that she is on her way to our office to get a pain med Rx since she is able to get a ride, and won't be able to for another month. I will send this to PA pool to see if they can address in Chelle's absence. Please advise. Rx pended.

## 2015-06-07 ENCOUNTER — Telehealth: Payer: Self-pay

## 2015-06-07 ENCOUNTER — Other Ambulatory Visit: Payer: Self-pay | Admitting: Internal Medicine

## 2015-06-07 NOTE — Telephone Encounter (Signed)
Pt is requesting a refill of ALPRAZolam (XANAX) 1 MG tablet. Pt would like for us to call this in to her pharmacy so that she doesn't have to come in to pick it up.

## 2015-06-08 NOTE — Telephone Encounter (Signed)
She also requested this via My Chart, and I have already authorized it.

## 2015-06-08 NOTE — Telephone Encounter (Signed)
Faxed

## 2015-06-08 NOTE — Telephone Encounter (Signed)
Called in Rx to [facility]

## 2015-06-08 NOTE — Telephone Encounter (Signed)
Meds ordered this encounter  Medications  . ALPRAZolam (XANAX) 1 MG tablet    Sig: TAKE 1 TABLET BY MOUTH THREE TIMES DAILY AS NEEDED FOR ANXIETY    Dispense:  90 tablet    Refill:  0    

## 2015-06-08 NOTE — Telephone Encounter (Signed)
It appears she is a patient of Porfirio Oarhelle Jeffery, PA-C. message forwarded to Chelle.

## 2015-06-12 ENCOUNTER — Telehealth: Payer: Self-pay | Admitting: Physician Assistant

## 2015-06-12 ENCOUNTER — Encounter: Payer: Self-pay | Admitting: Physician Assistant

## 2015-06-12 DIAGNOSIS — F329 Major depressive disorder, single episode, unspecified: Secondary | ICD-10-CM

## 2015-06-12 DIAGNOSIS — F32A Depression, unspecified: Secondary | ICD-10-CM

## 2015-06-12 MED ORDER — FLUOXETINE HCL 20 MG PO TABS: 20.0000 mg | ORAL_TABLET | Freq: Every day | ORAL | Status: DC

## 2015-06-12 NOTE — Telephone Encounter (Signed)
Was switched from Zoloft to Effexor XR in August. Causes nausea and vomiting. Has to carry Zofran ODT with her. Her new girlfriend uses Prozac, which seems to help, and she'd like to try it.  Meds ordered this encounter  Medications  . FLUoxetine (PROZAC) 20 MG tablet    Sig: Take 1 tablet (20 mg total) by mouth daily.    Dispense:  30 tablet    Refill:  3    Order Specific Question:  Supervising Provider    Answer:  DOOLITTLE, ROBERT P [[id number]]

## 2015-07-09 ENCOUNTER — Other Ambulatory Visit: Payer: Self-pay | Admitting: Physician Assistant

## 2015-07-09 NOTE — Telephone Encounter (Signed)
Meds ordered this encounter  Medications  . ALPRAZolam (XANAX) 1 MG tablet    Sig: TAKE 1 TABLET BY MOUTH THREE TIMES DAILY AS NEEDED FOR ANXIETY    Dispense:  90 tablet    Refill:  0    Please let her know that I need to see her for the next fill.

## 2015-07-09 NOTE — Telephone Encounter (Signed)
Pt called about a refill on her XANAX 1 MG. Please call [phone]     [facility] [address]

## 2015-07-10 NOTE — Telephone Encounter (Signed)
Called in Rx and notified pt, advised need for f/up. Pt agreed.

## 2015-09-01 ENCOUNTER — Telehealth: Payer: Self-pay

## 2015-09-01 MED ORDER — ALPRAZOLAM 1 MG PO TABS: 1.0000 mg | ORAL_TABLET | Freq: Three times a day (TID) | ORAL | Status: DC | PRN

## 2015-09-01 NOTE — Telephone Encounter (Signed)
Pt called again requesting a refill of xanax. Pt states that she needs this right away or else she feels like she might end up in the hospital. Please see previous message.

## 2015-09-01 NOTE — Telephone Encounter (Signed)
PATIENT WOULD LIKE Anita Sparks TO KNOW THAT SHE IS COMPLETELY OUT OF HER XANAX 1 MG. SHE WILL COME INTO THE WALK-IN TO SEE HER ON FEB. 6 TH, BECAUSE SHE HAS TO WAIT UNTIL SHE GETS PAID WHICH IS FEB 4 TH. PLEASE CALL HER WHEN IT HAS BEEN DONE. BEST PHONE [phone] (CELL)  PHARMACY CHOICE IS [facility] ON [address]  MBC

## 2015-09-01 NOTE — Telephone Encounter (Signed)
Pt called the answering service because her xanax wasn't at the pharmacy. She is out and has an appt in early Feb. States she is having a hard time so can't wait for a refill.   Reassured pt that she should call several days in advance for refills of her controlled sub since can only be filled by PCP. Fortunately for pt, looks like Chelle is on the wkend so she can come to 102 if needed. Will forward message to Chelle  I attempted to call in a temporary supply of 5 pills (see refill) but couldn't get through to [facility] and they did not have a provider line nor an answering service.

## 2015-09-02 ENCOUNTER — Other Ambulatory Visit: Payer: Self-pay | Admitting: Physician Assistant

## 2015-09-02 MED ORDER — ALPRAZOLAM 1 MG PO TABS: 1.0000 mg | ORAL_TABLET | Freq: Three times a day (TID) | ORAL | Status: DC | PRN

## 2015-09-02 NOTE — Telephone Encounter (Signed)
Meds ordered this encounter  Medications  . DISCONTD: ALPRAZolam (XANAX) 1 MG tablet    Sig: Take 1 tablet (1 mg total) by mouth 3 (three) times daily as needed. for anxiety    Dispense:  5 tablet    Refill:  0  . ALPRAZolam (XANAX) 1 MG tablet    Sig: Take 1 tablet (1 mg total) by mouth 3 (three) times daily as needed for anxiety. for anxiety    Dispense:  90 tablet    Refill:  0    Order Specific Question:  Supervising Provider    Answer:  DOOLITTLE, ROBERT P [[id number]]

## 2015-09-02 NOTE — Telephone Encounter (Signed)
rx called in to [facility] 

## 2015-09-03 ENCOUNTER — Telehealth: Payer: Self-pay

## 2015-09-03 NOTE — Telephone Encounter (Signed)
Advised pt that 90 tabs was called and is ready at [facility].  Advised her that Dr. Clelia Croft was on call and that's why she was in her chart and refilled the 5 tabs.  Chelle she wanted you to know that she will be in on 09/11/15 to see you.

## 2015-09-03 NOTE — Telephone Encounter (Signed)
Pt is requesting a refill on zanax and dr Clelia Croft gave her 5 pills, she is upset that dr Clelia Croft is even in her chart and needs to talk with someone about her refill request  Best number [phone]

## 2015-09-11 ENCOUNTER — Ambulatory Visit: Payer: PPO | Admitting: Physician Assistant

## 2015-09-20 ENCOUNTER — Telehealth: Payer: Self-pay

## 2015-09-20 DIAGNOSIS — I10 Essential (primary) hypertension: Secondary | ICD-10-CM

## 2015-09-20 NOTE — Telephone Encounter (Signed)
lilisinopril (PRINIVIL,ZESTRIL) 10 MG tablet   States he only received 30 days  - [facility] states they gave him 90.  He only has five tablets remaining    Chelle  [phone]

## 2015-09-23 MED ORDER — LISINOPRIL 10 MG PO TABS: 10.0000 mg | ORAL_TABLET | Freq: Every day | ORAL | Status: DC

## 2015-09-23 NOTE — Telephone Encounter (Signed)
Another 90 day was sent in.  [facility] and refills got missing.

## 2015-09-25 ENCOUNTER — Ambulatory Visit: Payer: PPO | Admitting: Physician Assistant

## 2015-11-27 ENCOUNTER — Telehealth: Payer: Self-pay

## 2015-11-27 NOTE — Telephone Encounter (Signed)
PATIENT IS REQUESTING Anita Sparks CALL IN A PRESCRIPTION FOR XANAX 1 MG. SHE SAID SHE TRIED TO GET AN APPOINTMENT FOR [facility]. HOWEVER, Anita Sparks IS NOT HERE. HER GIRL FRIEND TRIED TO COMMIT SUICIDE AND SHE HAS ANXIETY VERY BAD. BEST PHONE [phone] (CELL)  PHARMACY CHOICE IS [facility] ON [address].  MBC

## 2015-11-28 ENCOUNTER — Ambulatory Visit (INDEPENDENT_AMBULATORY_CARE_PROVIDER_SITE_OTHER): Payer: PPO | Admitting: Family Medicine

## 2015-11-28 DIAGNOSIS — F329 Major depressive disorder, single episode, unspecified: Secondary | ICD-10-CM | POA: Diagnosis not present

## 2015-11-28 DIAGNOSIS — S42002S Fracture of unspecified part of left clavicle, sequela: Secondary | ICD-10-CM | POA: Diagnosis not present

## 2015-11-28 DIAGNOSIS — R110 Nausea: Secondary | ICD-10-CM

## 2015-11-28 DIAGNOSIS — F649 Gender identity disorder, unspecified: Secondary | ICD-10-CM

## 2015-11-28 DIAGNOSIS — F411 Generalized anxiety disorder: Secondary | ICD-10-CM | POA: Diagnosis not present

## 2015-11-28 DIAGNOSIS — Z79890 Hormone replacement therapy (postmenopausal): Secondary | ICD-10-CM

## 2015-11-28 DIAGNOSIS — F32A Depression, unspecified: Secondary | ICD-10-CM

## 2015-11-28 MED ORDER — SPIRONOLACTONE 100 MG PO TABS: 100.0000 mg | ORAL_TABLET | Freq: Two times a day (BID) | ORAL | Status: DC

## 2015-11-28 MED ORDER — FLUOXETINE HCL 20 MG PO TABS: 40.0000 mg | ORAL_TABLET | Freq: Every day | ORAL | Status: DC

## 2015-11-28 MED ORDER — ONDANSETRON 4 MG PO TBDP: 4.0000 mg | ORAL_TABLET | Freq: Three times a day (TID) | ORAL | Status: DC | PRN

## 2015-11-28 MED ORDER — ALPRAZOLAM 1 MG PO TABS: 1.0000 mg | ORAL_TABLET | Freq: Three times a day (TID) | ORAL | Status: DC | PRN

## 2015-11-28 MED ORDER — ESTRADIOL CYPIONATE 5 MG/ML IM OIL: TOPICAL_OIL | INTRAMUSCULAR | Status: DC

## 2015-11-28 NOTE — Patient Instructions (Signed)
Please continue to explore meditation/yoga/mindfulness as coping strategies for your anxiety      IF you received an x-ray today, you will receive an invoice from [facility]. Please contact [facility] at [phone] with questions or concerns regarding your invoice.   IF you received labwork today, you will receive an invoice from [facility]. Please contact [facility] at [phone] with questions or concerns regarding your invoice.   Our billing staff will not be able to assist you with questions regarding bills from these companies.  You will be contacted with the lab results as soon as they are available. The fastest way to get your results is to activate your My Chart account. Instructions are located on the last page of this paperwork. If you have not heard from us regarding the results in 2 weeks, please contact this office.

## 2015-11-28 NOTE — Progress Notes (Signed)
Subjective:    Patient ID: Anita Sparks, female    DOB: 1965-08-17, 50 y.o.   MRN: [id number]  HPI This is a 50 yo female who presents today requesting medication refills. She reports that her fiance tried to commit suicide and is in the hospital for at least 2 weeks. She ran out of her xanax but has been taking her fiancees. She reports good control of depression and anxiety prior to recent incident. She does not feel like she is coping well and is interested in increasing fluoxetine dose and continuing TID xanax. She is feeling very anxious and is unable to leave her home.   She has a chronic left clavicle fracture which causes her pain daily and makes it difficult for her to sleep. She was told that she needs to have it repaired, but when she saw ortho in [place], she was told they would not fix it.   Past Medical History  Diagnosis Date  . Anxiety   . Depression   . Bipolar 1 disorder (HCC)   . Anxiety   . H/O clavicle fracture 10/2011    LEFT; not repaired  . Transgendered   . Anoxic brain injury (HCC)     infancy  . Self-mutilation     cutting, LEFT forearm. Not since 2006   Past Surgical History  Procedure Laterality Date  . Hernia repair  1980    hiatal  . Breast surgery      augmentation  . Tonsillectomy    . Myringotomy    . Facial reconstruction surgery  04/17/2015   Family History  Problem Relation Age of Onset  . Heart disease Father    Social History  Substance Use Topics  . Smoking status: Current Every Day Smoker  . Smokeless tobacco: Never Used  . Alcohol Use: No      Review of Systems Decreased appetite, no constipation/no diarrhea, GERD symptoms if she skips medicine, no chest pain, no SOB, no cough, some sneezing and itchy eyes, no headaches, no edema. Denies suicidal ideation.     Objective:   Physical Exam Physical Exam  Constitutional: Oriented to person, place, and time. She appears well-developed and well-nourished.  HENT:  Head:  Normocephalic and atraumatic.  Eyes: Conjunctivae are normal.  Neck: Normal range of motion. Neck supple.  Cardiovascular: Normal rate, regular rhythm and normal heart sounds.   Pulmonary/Chest: Effort normal and breath sounds normal.  Musculoskeletal: left clavicle with significant protrusion laterally, left shoulder with decreased ROM Neurological: Alert and oriented to person, place, and time.  Skin: Skin is warm and dry.  Psychiatric: Normal mood and affect. Behavior is normal. Judgment and thought content normal.  Vitals reviewed.  BP 148/90 mmHg  Pulse 83  Temp(Src) 98.3 F (36.8 C) (Oral)  Resp 16  Ht 5\' 9"  (1.753 m)  Wt 192 lb (87.091 kg)  BMI 28.34 kg/m2  SpO2 97% Wt Readings from Last 3 Encounters:  11/28/15 192 lb (87.091 kg)  05/01/15 205 lb 9.6 oz (93.26 kg)  03/19/15 218 lb 4 oz (98.998 kg)       Assessment & Plan:  1. Gender identity disorder - continue current meds  2. Hormone replacement therapy (HRT) - estradiol cypionate (DEPO-ESTRADIOL) 5 MG/ML injection; 2.24 mL IM Q 13 days  Dispense: 15 mL; Refill: 3 - spironolactone (ALDACTONE) 100 MG tablet; Take 1 tablet (100 mg total) by mouth 2 (two) times daily.  Dispense: 90 tablet; Refill: 3 - Estradiol; Future - Testosterone; Future - COMPLETE  METABOLIC PANEL WITH GFR; Future - she will come in on the appropriate day for estradiol level to be checked   3. Depression - will try increasing fluoxetine - FLUoxetine (PROZAC) 20 MG tablet; Take 2 tablets (40 mg total) by mouth daily.  Dispense: 60 tablet; Refill: 3  4. Nausea without vomiting - ondansetron (ZOFRAN-ODT) 4 MG disintegrating tablet; Take 1-2 tablets (4-8 mg total) by mouth every 8 (eight) hours as needed for nausea or vomiting.  Dispense: 60 tablet; Refill: 0 - COMPLETE METABOLIC PANEL WITH GFR; Future  5. Generalized anxiety disorder - encouraged her to explore stress reducing activities such as meditation, yoga/stretching, listening to relaxing  music or spoken word - discussed therapy and finding a support group, she states she is very limited by resources and has not found any support locally. - ALPRAZolam (XANAX) 1 MG tablet; Take 1 tablet (1 mg total) by mouth 3 (three) times daily as needed for anxiety. for anxiety  Dispense: 90 tablet; Refill: 1  6. Clavicle fracture, left, sequela - Ambulatory referral to Orthopedic Surgery   Olean Ree, FNP-BC  [facility]  11/30/2015 11:15 PM

## 2015-11-29 ENCOUNTER — Telehealth: Payer: Self-pay

## 2015-11-29 NOTE — Telephone Encounter (Signed)
Patient is calling because the zofran medication isn't covered through the insurance. Patient states that she needs a provider to call to get is approved. She states we did this before and it worked out okay. Please advise!  [phone]

## 2015-11-29 NOTE — Telephone Encounter (Signed)
Patient came in and was seen by one of my colleagues, who provided the requested medicaiton.

## 2015-12-04 ENCOUNTER — Telehealth: Payer: Self-pay

## 2015-12-04 NOTE — Telephone Encounter (Signed)
Patient is calling to let us know that she wasn't able to pay the co pay for the referrral due to the price of the co pay plus a 30 dollar cab ride.  Patient also wants us to get [facility]. I informed patient that she will need to fill out a release. She states she does not have a fax machine or a ride here.   Patient also needs pain medication. She states that the ibuprofen hasn't been helping even when combined with tylenol.   Please call! [phone]

## 2015-12-05 ENCOUNTER — Other Ambulatory Visit: Payer: Self-pay | Admitting: Family Medicine

## 2015-12-05 MED ORDER — CYCLOBENZAPRINE HCL 10 MG PO TABS: 10.0000 mg | ORAL_TABLET | Freq: Every evening | ORAL | Status: DC | PRN

## 2015-12-05 NOTE — Telephone Encounter (Signed)
Pt is calling back to day check on status of his pain medicaiton request patient  Is wanting a call back as soon as possible Best [phone]

## 2015-12-05 NOTE — Telephone Encounter (Signed)
Called pharm to clarify whether Rx needs a PA, because last year it did not need one. I was advised that the Rx went through for #60 and he will let pt know that it is ready.

## 2015-12-05 NOTE — Telephone Encounter (Signed)
Debbie, please advise.  

## 2015-12-05 NOTE — Telephone Encounter (Signed)
I have sent in a prescription for a muscle relaxer for her to take at bedtime. This is all I am able to offer her.

## 2015-12-07 ENCOUNTER — Telehealth: Payer: Self-pay

## 2015-12-07 NOTE — Telephone Encounter (Signed)
Done

## 2015-12-07 NOTE — Telephone Encounter (Signed)
Notified pt this med was sent in.

## 2015-12-07 NOTE — Telephone Encounter (Signed)
Spoke to pt to let her know muscle relaxer has been sent and she asked about this letter she wants written. She is very anxious to have this letter written and I explained that Chelle won't be back in office until Monday. She then asked if Eunice BlaseDebbie can write it for her, and advised that Eunice BlaseDebbie won't be in until Mayues, so it would be faster to send it to [place]. Pt did not want to wait and was very insistent that I send it to another provider so that they can write the letter from reading OV notes from Chelle/Debbie. I advised pt that I am not sure if anyone else can write it for her, but that I will forward to PA pool.

## 2015-12-07 NOTE — Telephone Encounter (Signed)
Patient would like a medical release form mailed to her since she doesn't have transportation. Correct address is on file
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
## 2015-12-07 NOTE — Telephone Encounter (Signed)
Spoke to pt and advised that a release form has been mailed out to her.

## 2015-12-07 NOTE — Telephone Encounter (Signed)
This really is something that either Chelle or Debbie should handle. I am confused about why she needs this letter. Who is it for? It's for her father's documentation? That seems odd. Also, isn't that the point of the appt with ortho so THEY can decide whether this is a medically necessary surgery?  She will need to wait for either Chelle or Debbie to write this letter, should they decide a letter is necessary. Please call pt and let her know she will need to wait. Please forward to Chelle's box.

## 2015-12-08 NOTE — Telephone Encounter (Signed)
Anita Sparks I spoke with patient, the letter is for her father to pay her medical bills.  He will not help her with copays if she doesn't have a letter stating what is wrong and that she needs to see a specialist.

## 2015-12-08 NOTE — Telephone Encounter (Signed)
Patient states she called yesterday and said she was in pain and Flexeril was sent in and it isn't touching her pain and she wants something else.

## 2015-12-09 ENCOUNTER — Telehealth: Payer: Self-pay | Admitting: Emergency Medicine
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
## 2015-12-10 NOTE — Telephone Encounter (Signed)
Letter written & printed. 

## 2015-12-10 NOTE — Telephone Encounter (Signed)
Advised pt letter is ready for pick up

## 2015-12-13 ENCOUNTER — Telehealth: Payer: Self-pay
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
## 2015-12-14 ENCOUNTER — Telehealth: Payer: Self-pay | Admitting: Physician Assistant

## 2015-12-14 NOTE — Telephone Encounter (Signed)
Unfortunately, no. I also spoke with Deboraha Sprangebbie Gessner, who evaluated her for this problem, and as she told the patient, she did not believe that opiate pain medication was appropriate.  Other than following her recent surgery, we have not prescribed any opiates as long as she has been a patient here.  Let's make sure she gets an appointment with orthopedics.

## 2015-12-14 NOTE — Telephone Encounter (Signed)
Pt advised.
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
## 2015-12-14 NOTE — Telephone Encounter (Signed)
See previous message

## 2015-12-15 ENCOUNTER — Telehealth: Payer: Self-pay | Admitting: Family Medicine

## 2015-12-15 NOTE — Telephone Encounter (Signed)
Noted  

## 2015-12-15 NOTE — Telephone Encounter (Signed)
Pt called to let you know that she doesn't need the pain medicine that she has brought some percocet from a friend until her surgery in two weeks she just wanted you to know this

## 2016-01-04 ENCOUNTER — Telehealth: Payer: Self-pay

## 2016-01-04 NOTE — Telephone Encounter (Signed)
Msg is for Anita Sparks, pt will be faxing over clearance for surgery for both her and partner. What will need to be included for this is results from blood test and stating they are okay for surgery. Anita Sparks is getting her left breast repaired and partner Anita Sparks, is getting a breast augmentation surgery.  Please advise for any additional details  [phone]

## 2016-01-04 NOTE — Telephone Encounter (Signed)
Patient wanted to let Theora Gianottihelle Jeffrey know her Surgeon's assistant Marian SorrowOlga will be contacting our office. She stated it is in regards to what is needed for pre-surgery. If any questions patients call back number is [phone]

## 2016-01-05 NOTE — Telephone Encounter (Signed)
Please advise Anita Sparks that surgical clearance requires a visit.  I have not seen Anita Sparks since 03/2015. She will need an EKG and CBC. We can use the metabolic panel that was done in April, since it was normal.  I have seen Anita Sparks only once, 06/2015. We can use the labs and EKG done during her recent hospitalization, also in April, but I need to see her before I can clear her for surgery.

## 2016-01-07 NOTE — Telephone Encounter (Signed)
FYI

## 2016-01-07 NOTE — Telephone Encounter (Signed)
Great. She should wait to come see me for clearance until we have the information from [place], but please remind the patient that I will need to see her.

## 2016-01-07 NOTE — Telephone Encounter (Signed)
Pt advised and will come in! 

## 2016-01-08 NOTE — Telephone Encounter (Signed)
I advised her and she understands.

## 2016-01-15 ENCOUNTER — Telehealth: Payer: Self-pay | Admitting: Physician Assistant

## 2016-01-15 NOTE — Telephone Encounter (Signed)
Patient request for Chelle to give her a call. Patient is concerned about herself and fiance. [phone].

## 2016-01-17 NOTE — Telephone Encounter (Signed)
Spoke with pt he has surgery on 7/10  And Anita Sparks is has surgery on 7/11. Chelle do you have the papers that has the bloodwork they need?

## 2016-01-18 NOTE — Telephone Encounter (Signed)
I have not received paperwork for their upcoming surgeries.

## 2016-01-18 NOTE — Telephone Encounter (Signed)
Spoke with Anita Sparks she states she is going to send the paperwork for pt and her partner and she will call pt.

## 2016-01-23 ENCOUNTER — Ambulatory Visit (INDEPENDENT_AMBULATORY_CARE_PROVIDER_SITE_OTHER): Payer: PPO | Admitting: Physician Assistant

## 2016-01-23 VITALS — BP 122/80 | HR 86 | Temp 98.2°F | Resp 16 | Ht 69.0 in | Wt 202.0 lb

## 2016-01-23 DIAGNOSIS — Z79890 Hormone replacement therapy (postmenopausal): Secondary | ICD-10-CM

## 2016-01-23 DIAGNOSIS — I10 Essential (primary) hypertension: Secondary | ICD-10-CM | POA: Diagnosis not present

## 2016-01-23 DIAGNOSIS — F329 Major depressive disorder, single episode, unspecified: Secondary | ICD-10-CM

## 2016-01-23 DIAGNOSIS — D72829 Elevated white blood cell count, unspecified: Secondary | ICD-10-CM

## 2016-01-23 DIAGNOSIS — F411 Generalized anxiety disorder: Secondary | ICD-10-CM

## 2016-01-23 DIAGNOSIS — F649 Gender identity disorder, unspecified: Secondary | ICD-10-CM | POA: Diagnosis not present

## 2016-01-23 DIAGNOSIS — S42002S Fracture of unspecified part of left clavicle, sequela: Secondary | ICD-10-CM

## 2016-01-23 DIAGNOSIS — K219 Gastro-esophageal reflux disease without esophagitis: Secondary | ICD-10-CM

## 2016-01-23 DIAGNOSIS — Z7901 Long term (current) use of anticoagulants: Secondary | ICD-10-CM | POA: Diagnosis not present

## 2016-01-23 DIAGNOSIS — Z1211 Encounter for screening for malignant neoplasm of colon: Secondary | ICD-10-CM

## 2016-01-23 DIAGNOSIS — F32A Depression, unspecified: Secondary | ICD-10-CM

## 2016-01-23 DIAGNOSIS — Z79899 Other long term (current) drug therapy: Secondary | ICD-10-CM | POA: Diagnosis not present

## 2016-01-23 DIAGNOSIS — Z01818 Encounter for other preprocedural examination: Secondary | ICD-10-CM

## 2016-01-23 LAB — CBC WITH DIFFERENTIAL/PLATELET
BASOS PCT: 0 %
Basophils Absolute: 0 cells/uL (ref 0–200)
EOS PCT: 0 %
Eosinophils Absolute: 0 cells/uL — ABNORMAL LOW (ref 15–500)
HEMATOCRIT: 44.7 % (ref 35.0–45.0)
HEMOGLOBIN: 15 g/dL (ref 11.7–15.5)
LYMPHS ABS: 3042 {cells}/uL (ref 850–3900)
Lymphocytes Relative: 18 %
MCH: 31.9 pg (ref 27.0–33.0)
MCHC: 33.6 g/dL (ref 32.0–36.0)
MCV: 95.1 fL (ref 80.0–100.0)
MONO ABS: 1014 {cells}/uL — AB (ref 200–950)
MPV: 9.3 fL (ref 7.5–12.5)
Monocytes Relative: 6 %
NEUTROS ABS: 12844 {cells}/uL — AB (ref 1500–7800)
Neutrophils Relative %: 76 %
Platelets: 290 10*3/uL (ref 140–400)
RBC: 4.7 MIL/uL (ref 3.80–5.10)
RDW: 13.3 % (ref 11.0–15.0)
WBC: 16.9 10*3/uL — AB (ref 3.8–10.8)

## 2016-01-23 LAB — COMPREHENSIVE METABOLIC PANEL
ALBUMIN: 4.2 g/dL (ref 3.6–5.1)
ALT: 10 U/L (ref 6–29)
AST: 12 U/L (ref 10–35)
Alkaline Phosphatase: 79 U/L (ref 33–130)
BUN: 14 mg/dL (ref 7–25)
CHLORIDE: 98 mmol/L (ref 98–110)
CO2: 25 mmol/L (ref 20–31)
Calcium: 9.6 mg/dL (ref 8.6–10.4)
Creat: 0.88 mg/dL (ref 0.50–1.05)
Glucose, Bld: 90 mg/dL (ref 65–99)
POTASSIUM: 3.9 mmol/L (ref 3.5–5.3)
Sodium: 136 mmol/L (ref 135–146)
TOTAL PROTEIN: 7 g/dL (ref 6.1–8.1)
Total Bilirubin: 0.7 mg/dL (ref 0.2–1.2)

## 2016-01-23 LAB — LIPID PANEL
Cholesterol: 168 mg/dL (ref 125–200)
HDL: 42 mg/dL — AB (ref >=46)
LDL Cholesterol: 106 mg/dL (ref <130)
Total CHOL/HDL Ratio: 4 Ratio (ref <=5.0)
Triglycerides: 99 mg/dL (ref <150)
VLDL: 20 mg/dL (ref <30)

## 2016-01-23 MED ORDER — SPIRONOLACTONE 100 MG PO TABS: 200.0000 mg | ORAL_TABLET | Freq: Once | ORAL | Status: DC

## 2016-01-23 MED ORDER — ALPRAZOLAM 1 MG PO TABS: 1.0000 mg | ORAL_TABLET | Freq: Three times a day (TID) | ORAL | Status: DC

## 2016-01-23 MED ORDER — FLUOXETINE HCL 20 MG PO TABS: 40.0000 mg | ORAL_TABLET | Freq: Every day | ORAL | Status: DC

## 2016-01-23 MED ORDER — OMEPRAZOLE 40 MG PO CPDR
40.0000 mg | DELAYED_RELEASE_CAPSULE | Freq: Every day | ORAL | Status: DC
Start: 2016-01-23 — End: 2016-09-05

## 2016-01-23 MED ORDER — ALPRAZOLAM 1 MG PO TABS: 1.0000 mg | ORAL_TABLET | Freq: Three times a day (TID) | ORAL | Status: DC | PRN

## 2016-01-23 MED ORDER — LISINOPRIL 10 MG PO TABS: 10.0000 mg | ORAL_TABLET | Freq: Every day | ORAL | Status: DC

## 2016-01-23 MED ORDER — "SYRINGE 22G X 1-1/2"" 3 ML MISC": Status: DC

## 2016-01-23 MED ORDER — ALPRAZOLAM 1 MG PO TABS
1.0000 mg | ORAL_TABLET | Freq: Three times a day (TID) | ORAL | Status: DC | PRN
Start: 2016-01-23 — End: 2016-05-09

## 2016-01-23 NOTE — Progress Notes (Signed)
Patient ID: Anita Sparks, female    DOB: November 27, 1965, 50 y.o.   MRN: [id number]  PCP: Porfirio Oar, PA-C  Subjective:   Chief Complaint  Patient presents with  . Surgical clearance    Breast scar tissue revision    HPI Presents for surgical clearance. She is accompanied by her fiance, Anita Sparks, who is also female-to-female transgender and will be having breast augmentation the day following her surgery.  Next month, she is scheduled for repair of capsulitis of the LEFT breast implant.  Anticipates 3 months to heal from the breast surgery. Reports the surgeon's fee is waived as this is a complication of the previous surgery. She does have to pay for the anesthesiologist.  She then plans to have the old clavicle fracture repair, from which she understands that healing will take longer.  Leaves 02/08/16 on a bus to [place]. Will have surgery 02/11/16, Anita Sparks's on 02/12/2016. They will leave [place] 7/15, and expect to return to [place] on 7/17.  Never received records from [facility] (where she previously received hormone therapy), despite the faxed request.   Review of Systems  Constitutional: Negative for activity change, appetite change, fatigue and unexpected weight change.  HENT: Negative for congestion, dental problem, ear pain, hearing loss, mouth sores, postnasal drip, rhinorrhea, sneezing, sore throat, tinnitus and trouble swallowing.   Eyes: Negative for photophobia, pain, redness and visual disturbance.  Respiratory: Negative for cough, chest tightness and shortness of breath.   Cardiovascular: Negative for chest pain, palpitations and leg swelling.  Gastrointestinal: Negative for nausea, vomiting, abdominal pain, diarrhea, constipation and blood in stool.  Genitourinary: Negative for dysuria, urgency, frequency and hematuria.  Musculoskeletal: Negative for myalgias, arthralgias, gait problem and neck stiffness.  Skin: Negative for rash.  Neurological:  Negative for dizziness, speech difficulty, weakness, light-headedness, numbness and headaches.  Hematological: Negative for adenopathy.  Psychiatric/Behavioral: Negative for confusion and sleep disturbance. The patient is not nervous/anxious.        Patient Active Problem List   Diagnosis Date Noted  . Hepatitis C antibody test positive 03/08/2015  . 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 Gender identity disorder 03/08/2015  . Gender dysphoria 03/08/2015  . Hormone replacement therapy (HRT) 03/07/2015  . Leukocytosis 01/30/2015  . Anoxic brain injury (HCC) 01/12/2015  . 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 Benign essential HTN 01/12/2015  . GERD (gastroesophageal reflux disease) 01/12/2015  . Breast pain 01/12/2015  . Tinnitus 01/12/2015  . Edentulous 01/12/2015  . BMI 30.0-30.9,adult 01/12/2015  . Smoker 01/12/2015  . Bipolar 1 disorder (HCC)      Prior to Admission medications   Medication Sig Start Date End Date Taking? Authorizing Provider  ALPRAZolam Prudy Feeler) 1 MG tablet Take 1 tablet (1 mg total) by mouth 3 (three) times daily as needed for anxiety. for anxiety 11/28/15  Yes Emi Belfast, FNP  aspirin 81 MG tablet Take 81 mg by mouth daily.   Yes Historical Provider, MD  cyclobenzaprine (FLEXERIL) 10 MG tablet Take 1 tablet (10 mg total) by mouth at bedtime as needed for muscle spasms. 12/05/15  Yes Emi Belfast, FNP  estradiol cypionate (DEPO-ESTRADIOL) 5 MG/ML injection 2.24 mL IM Q 13 days 11/28/15  Yes Emi Belfast, FNP  FLUoxetine (PROZAC) 20 MG tablet Take 2 tablets (40 mg total) by mouth daily. 11/28/15  Yes Emi Belfast, FNP  lisinopril (PRINIVIL,ZESTRIL) 10 MG tablet Take 1 tablet (10 mg total) by mouth daily. 09/23/15  Yes Archimedes Harold, PA-C  omeprazole (  PRILOSEC) 40 MG capsule Take 1 capsule (40 mg total) by mouth daily. 01/12/15  Yes Maurica Omura, PA-C  ondansetron (ZOFRAN-ODT) 4 MG disintegrating tablet Take 1-2 tablets (4-8 mg total) by mouth every 8  (eight) hours as needed for nausea or vomiting. 11/28/15  Yes Emi Belfast, FNP  spironolactone (ALDACTONE) 100 MG tablet Take 1 tablet (100 mg total) by mouth 2 (two) times daily. 11/28/15  Yes Emi Belfast, FNP  Syringe/Needle, Disp, (SYRINGE 3CC/22GX1-1/2") 22G X 1-1/2" 3 ML MISC Use as directed 01/12/15  Yes Lasean Rahming, PA-C     Allergies  Allergen Reactions  . Wellbutrin [Bupropion] Palpitations       Objective:  Physical Exam  Constitutional: She is oriented to person, place, and time. She appears well-developed and well-nourished. She is active and cooperative. No distress.  BP 122/80 mmHg  Pulse 86  Temp(Src) 98.2 F (36.8 C) (Oral)  Resp 16  Ht 5\' 9"  (1.753 m)  Wt 202 lb (91.627 kg)  BMI 29.82 kg/m2  SpO2 97%  HENT:  Head: Normocephalic and atraumatic.  Right Ear: Hearing normal.  Left Ear: Hearing normal.  Eyes: Conjunctivae are normal. No scleral icterus.  Neck: Normal range of motion. Neck supple. No thyromegaly present.  Cardiovascular: Normal rate, regular rhythm and normal heart sounds.   Pulses:      Radial pulses are 2+ on the right side, and 2+ on the left side.  Pulmonary/Chest: Effort normal and breath sounds normal.  Lymphadenopathy:       Head (right side): No tonsillar, no preauricular, no posterior auricular and no occipital adenopathy present.       Head (left side): No tonsillar, no preauricular, no posterior auricular and no occipital adenopathy present.    She has no cervical adenopathy.       Right: No supraclavicular adenopathy present.       Left: No supraclavicular adenopathy present.  Neurological: She is alert and oriented to person, place, and time. No sensory deficit.  Skin: Skin is warm, dry and intact. No rash noted. No cyanosis or erythema. Nails show no clubbing.  Psychiatric: She has a normal mood and affect. Her speech is normal and behavior is normal.    EKG reviewed with Dr. Cleta Alberts is normal.       Assessment &  Plan:   1. Pre-operative clearance Normal EKG. Anticipate normal labs, other than chronic leucocytosis. If no, will send letter clearing her for surgery. - Protime-INR - APTT  2. Hormone replacement therapy (HRT) Update labs. Refills of needed med/supplies. - Estrogens, total - Estradiol - Testos,Total,Free and SHBG (Female) - Comprehensive metabolic panel - PSA - Lipid panel - spironolactone (ALDACTONE) 100 MG tablet; Take 2 tablets (200 mg total) by mouth once.  Dispense: 180 tablet; Refill: 3 - Syringe/Needle, Disp, (SYRINGE 3CC/22GX1-1/2") 22G X 1-1/2" 3 ML MISC; Use as directed  Dispense: 50 each; Refill: 0  3. Gender identity disorder See above.  4. Clavicle fracture, left, sequela This causes her a lot of pain, such that she desires repair. Plans to address it once she recovers from the breast surgery.  5. Generalized anxiety disorder Stable with current treatment. - ALPRAZolam (XANAX) 1 MG tablet; Take 1 tablet (1 mg total) by mouth 3 (three) times daily as needed for anxiety. for anxiety  Dispense: 90 tablet; Refill: 0 - ALPRAZolam (XANAX) 1 MG tablet; Take 1 tablet (1 mg total) by mouth 3 (three) times daily as needed for anxiety.  Dispense: 90 tablet; Refill:  0 - ALPRAZolam (XANAX) 1 MG tablet; Take 1 tablet (1 mg total) by mouth 3 (three) times daily.  Dispense: 90 tablet; Refill: 0  6. Leukocytosis Chronic. - CBC with Differential/Platelet  7. Screening for colon cancer - Ambulatory referral to Gastroenterology  8. Benign essential HTN Controlled. Continue current treatment. - lisinopril (PRINIVIL,ZESTRIL) 10 MG tablet; Take 1 tablet (10 mg total) by mouth daily.  Dispense: 90 tablet; Refill: 3 - EKG 12-Lead  9. Gastroesophageal reflux disease without esophagitis Stable. Continue current treatment. - omeprazole (PRILOSEC) 40 MG capsule; Take 1 capsule (40 mg total) by mouth daily.  Dispense: 90 capsule; Refill: 3  10. Depression Stable. Continue current  treatment. - FLUoxetine (PROZAC) 20 MG tablet; Take 2 tablets (40 mg total) by mouth daily.  Dispense: 180 tablet; Refill: 3   Fernande Brashelle S. Zyree Traynham, PA-C Physician Assistant-Certified [facility]

## 2016-01-23 NOTE — Patient Instructions (Addendum)
Once I receive the results of your labs, I'll send a letter to the surgeon. As long as everything is what we expect, I will have no problem clearing you for the procedure.    IF you received an x-ray today, you will receive an invoice from [facility]. Please contact [facility] at [phone] with questions or concerns regarding your invoice.   IF you received labwork today, you will receive an invoice from [facility]. Please contact [facility] at [phone] with questions or concerns regarding your invoice.   Our billing staff will not be able to assist you with questions regarding bills from these companies.  You will be contacted with the lab results as soon as they are available. The fastest way to get your results is to activate your My Chart account. Instructions are located on the last page of this paperwork. If you have not heard from us regarding the results in 2 weeks, please contact this office.

## 2016-01-24 LAB — PROTIME-INR
INR: 1
PROTHROMBIN TIME: 10.7 s (ref 9.0–11.5)

## 2016-01-24 LAB — PSA

## 2016-01-24 LAB — APTT: aPTT: 33 s (ref 22–34)

## 2016-01-29 LAB — ESTROGENS, TOTAL: Estrogen: 858.7 pg/mL — ABNORMAL HIGH

## 2016-01-30 LAB — ESTRADIOL, FREE
ESTRADIOL FREE: 12.81 pg/mL
ESTRADIOL: 627 pg/mL

## 2016-01-31 ENCOUNTER — Telehealth: Payer: Self-pay

## 2016-01-31 NOTE — Telephone Encounter (Signed)
fax request to clarify SIG:  take 2 tablets by mounth once DAILY confirmed with Harlin Heys. Gessner NP Fax returned with DAILY added to rx.

## 2016-01-31 NOTE — Telephone Encounter (Signed)
Please call re. Lab results ° [phone] °

## 2016-02-01 ENCOUNTER — Encounter: Payer: Self-pay | Admitting: Physician Assistant

## 2016-02-01 ENCOUNTER — Telehealth: Payer: Self-pay | Admitting: Emergency Medicine

## 2016-02-01 NOTE — Telephone Encounter (Signed)
I will send the results and letter directly to the surgeon.  All normal.

## 2016-02-01 NOTE — Telephone Encounter (Signed)
Pt called back again for lab results.  She stated that [place] and will need these results for pre-surgical tests.  Chelle please advise so we may call the pt back with these results.

## 2016-02-01 NOTE — Telephone Encounter (Signed)
Made pt aware, Chelle will call with lab results for she and fiance. Pt verbalized understanding

## 2016-02-02 ENCOUNTER — Encounter: Payer: Self-pay | Admitting: Physician Assistant

## 2016-02-02 LAB — TESTOS,TOTAL,FREE AND SHBG (FEMALE)
SEX HORMONE BINDING GLOB.: 60 nmol/L (ref 17–124)
TESTOSTERONE,TOTAL,LC/MS/MS: 3 ng/dL (ref 2–45)
Testosterone, Free: 0.2 pg/mL (ref 0.1–6.4)

## 2016-02-05 IMAGING — CR DG CHEST 2V
2 series · 2 of 2 positions shown · non-contrast
Comparison: None.

CLINICAL DATA: Cough since 01/13/2015.

EXAM:
CHEST  2 VIEW

[PA]
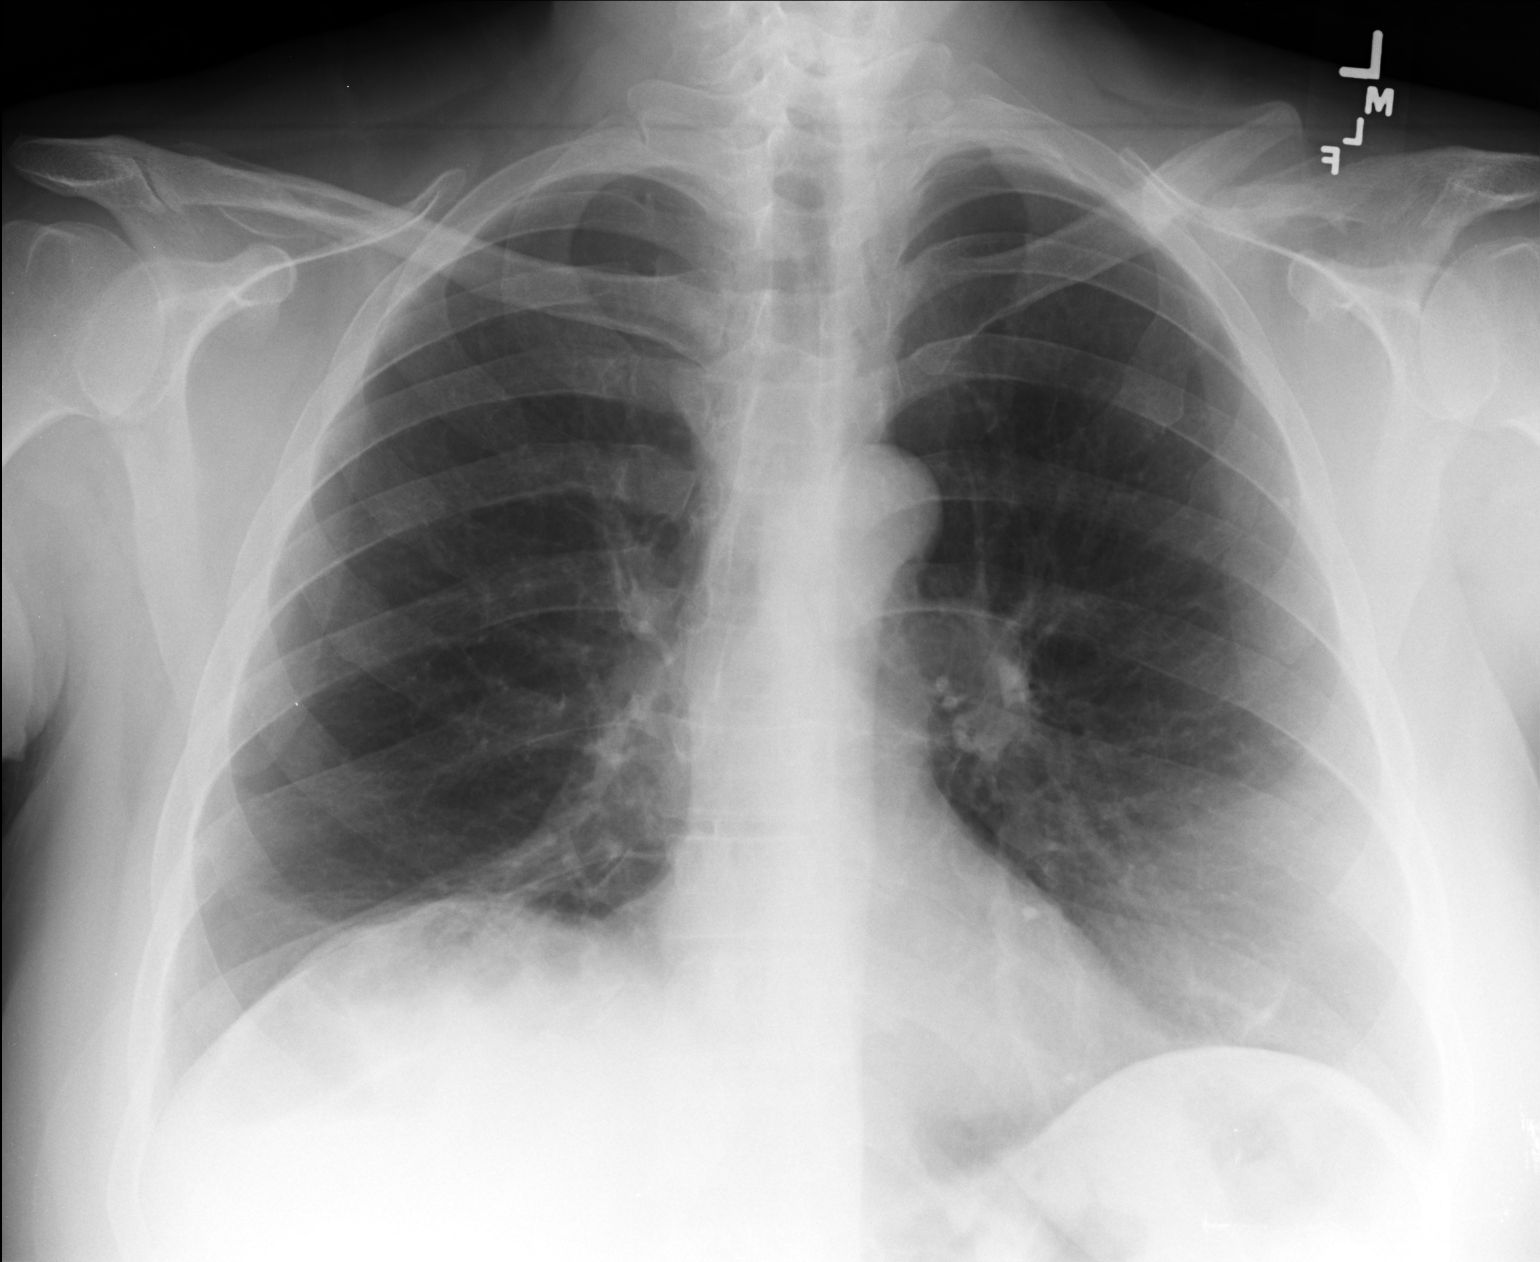

[lateral]
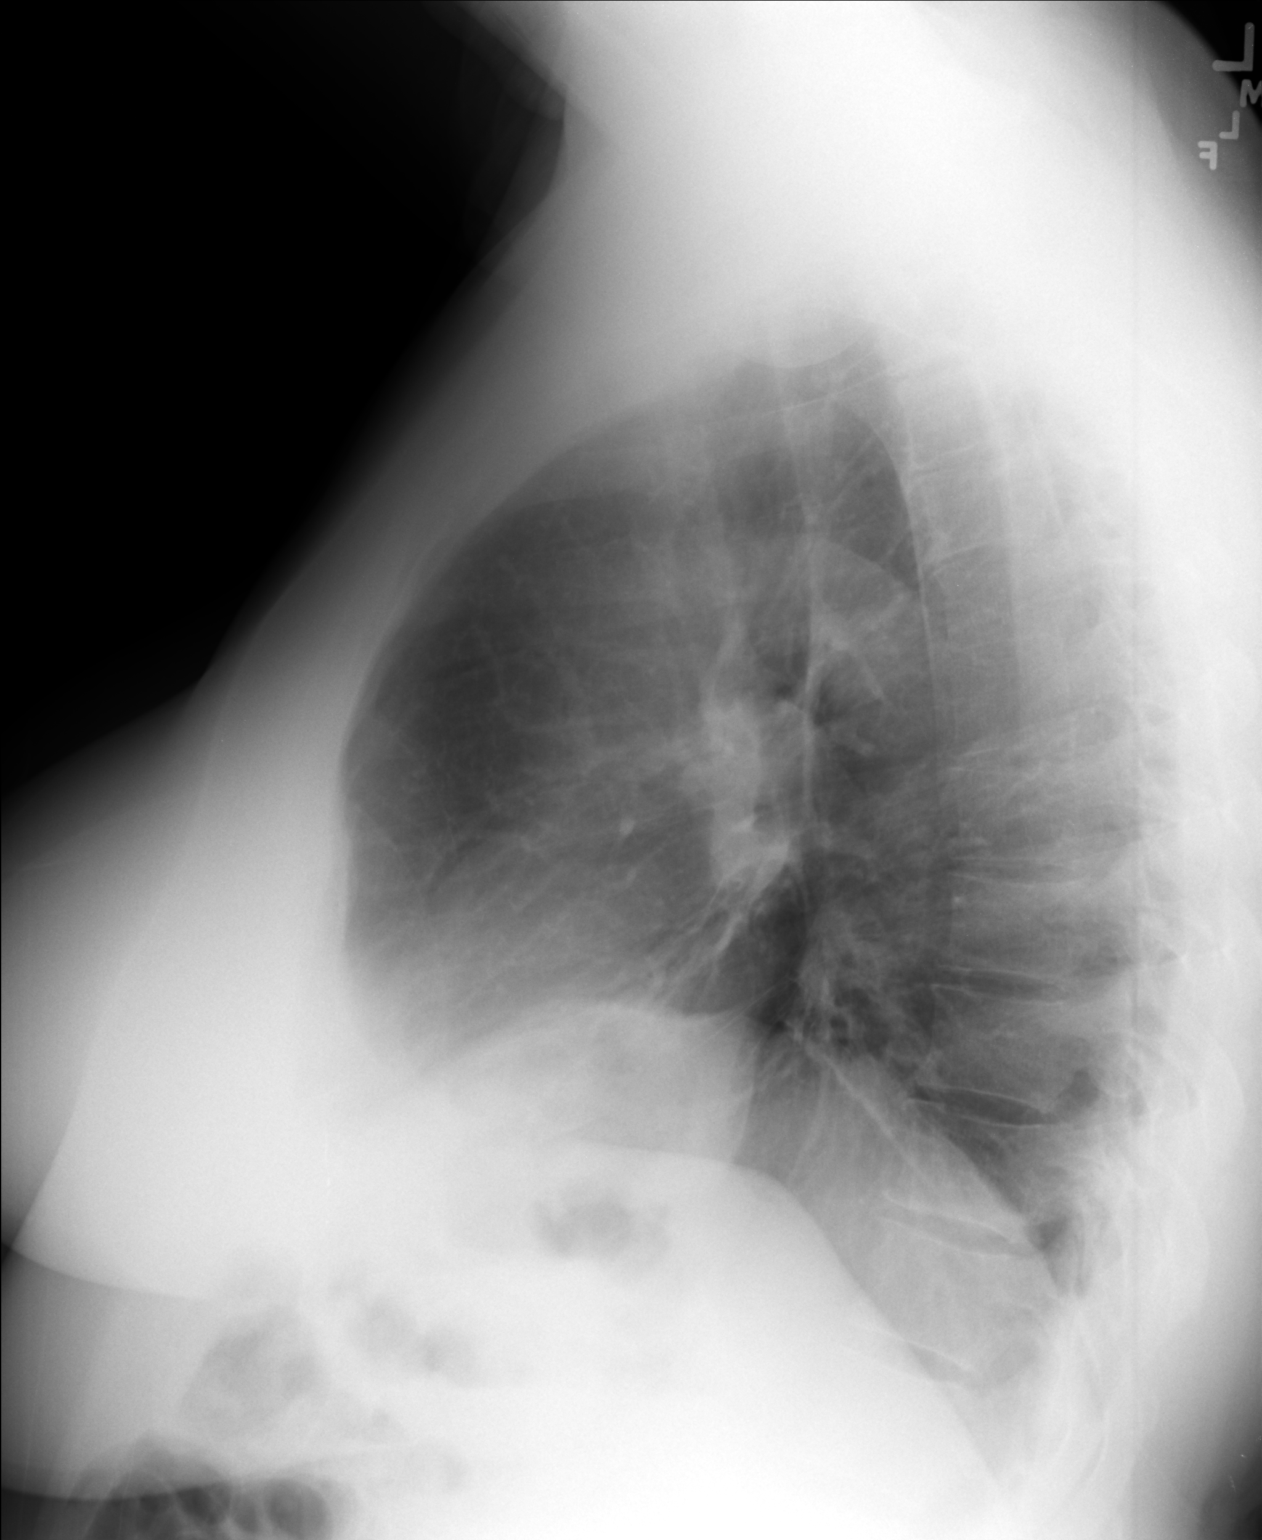

[2 of 2 positions shown; findings below may reference images not displayed]

FINDINGS: The heart size and mediastinal contours are within normal limits.
Both lungs are clear. The visualized skeletal structures are
unremarkable.
IMPRESSION: No active cardiopulmonary disease.

## 2016-02-11 ENCOUNTER — Telehealth: Payer: Self-pay

## 2016-02-11 NOTE — Telephone Encounter (Signed)
Pt is  In [place] and has left the blood pressure meds in [place] and is having surgery tomorrow and he has questions about what  The plastic surgeon has said -tried to get clinical to speak unavailable and pt the asked for manager and unavailable as well   Best number [phone]

## 2016-02-11 NOTE — Telephone Encounter (Signed)
Pt wants to double up on Lisinopril for her surgery tomorrow. Her blood pressure is up and she needs an answer now. I was not able to provide an answer. Please review and advise pt. [place] is in [place] now.

## 2016-02-12 ENCOUNTER — Telehealth: Payer: Self-pay

## 2016-02-12 NOTE — Telephone Encounter (Signed)
Pt is having surgery tomorrow and she needs someone to call and discuss that she has had a elevated WBC for a while now   Best number [phone]

## 2016-02-12 NOTE — Telephone Encounter (Signed)
REfaxed letter with documentation that leucocytosis is chronic and stable.  Successful transmission notice received.

## 2016-02-12 NOTE — Telephone Encounter (Signed)
Anita SorrowOlga called to see about getting the office notes faxed over to her regarding WBC fax number [phone]   She states they need this today because surgery is tomorrow and is already been rescheduled from yestesrday  Best number [phone]

## 2016-02-12 NOTE — Telephone Encounter (Signed)
Patient wants to increase Lisinopril to 20 mg to lower the blood pressure for surgery tomorrow.   Chelle  Can you respond today.   [phone]

## 2016-02-19 ENCOUNTER — Telehealth: Payer: Self-pay

## 2016-02-19 ENCOUNTER — Other Ambulatory Visit: Payer: Self-pay

## 2016-02-19 NOTE — Telephone Encounter (Signed)
Anita Sparks,  Patient has pain in left breast, surgeon would not do the surgery on the breast, states there is infection.  However, the face surgery was performed.   Please call plastic surgeon and tell them surgery is needed, as there is a lot  Of pain in the breast.   You have their number.   Also call insurance company and notify them breast surgery is for  Pain  [phone]

## 2016-02-19 NOTE — Telephone Encounter (Signed)
clarify orders to read Sig: Take two tablets by mouth ONCE DAILY

## 2016-02-20 NOTE — Telephone Encounter (Signed)
Please call Anita Sparks.  I cannot tell the surgeon to perform a surgery that s/he doesn't think is safe (in this case due to an infection). I presume that her surgeon has prescribed treatment for the infection and has rescheduled the procedure.   It is also most appropriate for the surgeon to contact the insurance company regarding the procedure that s/he is to perform, with the rationale, so that the insurance can determine payment.

## 2016-02-20 NOTE — Telephone Encounter (Signed)
Attempted to call pt, left VM for pt to call back asap  

## 2016-02-22 ENCOUNTER — Encounter: Payer: Self-pay | Admitting: Physician Assistant

## 2016-02-27 ENCOUNTER — Ambulatory Visit (INDEPENDENT_AMBULATORY_CARE_PROVIDER_SITE_OTHER): Payer: PPO | Admitting: Physician Assistant

## 2016-02-27 VITALS — BP 116/74 | HR 90 | Temp 98.3°F | Resp 18 | Ht 69.0 in | Wt 208.2 lb

## 2016-02-27 DIAGNOSIS — Z4802 Encounter for removal of sutures: Secondary | ICD-10-CM | POA: Diagnosis not present

## 2016-02-27 NOTE — Progress Notes (Signed)
Chief Complaint  Patient presents with  . Suture / Staple Removal    back of head    History of Present Illness: Patient presents for suture removal.  She traveled [place] to see her plastic surgeon for facial surgery and LEFT breast surgery. She has had previous female facial surgery, but desired a more feminine appearing face than the initial procedure afforded. In addition, the LEFT breast has been painful since her initial augmentation, and she has had a previous procedure to address the capsulitis, without benefit. She was to have a repeat procedure to address that.  Upon arrival on her day of surgery, she was told that there was an infection in the LEFT breast, and the procedure could not be performed. She reports that she was advised that "a tissue sample" revealed the infection, but denies that any specimen was collected. There is a reddened area under the LEFT breast, but there is no lesion, wound, drainage. The entire breast is tender, this area not moreso, and not worse that her ususal chronic, daily pain.  However, her facial surgery was performed as planned, and she was placed of cephalexin prophylactically. The surgeon apparently then prescribed "a stronger one" for the breast infection, but she experienced itching, so stopped it. She was prescribed another antibiotic to replace it with, but hasn't started it out of fear that she will react to it as well.  She is performing wound care as instructed, and needs the staples and sutures removed from the facial procedure. The area is mildly tender, but she has not had any swelling, redness or drainage. No fever. She states that she is disappointed in the results because she wanted to look like a woman without having to wear makeup, and does not believe those results have been achieved.  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 Due to the increased cost of performing the surgery on an inpatient basis, she was not able to have it, and both [place] are extremely disappointed.   Allergies  Allergen Reactions  . Wellbutrin [Bupropion] Palpitations    Prior to Admission medications   Medication Sig Start Date End Date Taking? Authorizing Provider  ALPRAZolam Prudy Feeler) 1 MG tablet Take 1 tablet (1 mg total) by mouth 3 (three) times daily as needed for anxiety. for anxiety 01/23/16  Yes Mattheus Rauls, PA-C  ALPRAZolam (XANAX) 1 MG tablet Take 1 tablet (1 mg total) by mouth 3 (three) times daily as needed for anxiety. 01/23/16  Yes Fabienne Nolasco, PA-C  ALPRAZolam (XANAX) 1 MG tablet Take 1 tablet (1 mg total) by mouth 3 (three) times daily. 01/23/16  Yes Lizanne Erker, PA-C  aspirin 81 MG tablet Take 81 mg by mouth daily.   Yes Historical Provider, MD  Carisoprodol (SOMA PO) Take by mouth.   Yes Historical Provider, MD  cyclobenzaprine (FLEXERIL) 10 MG tablet Take 1 tablet (10 mg total) by mouth at bedtime as needed for muscle spasms. 12/05/15  Yes Emi Belfast, FNP  estradiol cypionate (DEPO-ESTRADIOL) 5 MG/ML injection 2.24 mL IM Q 13 days 11/28/15  Yes Emi Belfast, FNP  FLUoxetine (PROZAC) 20 MG tablet Take 2 tablets (40 mg total) by mouth daily. 01/23/16  Yes Aidee Latimore, PA-C  lisinopril (PRINIVIL,ZESTRIL) 10  MG tablet Take 1 tablet (10 mg total) by mouth daily. 01/23/16  Yes Tae Robak, PA-C  omeprazole (PRILOSEC) 40 MG capsule Take 1 capsule (40 mg total) by mouth daily. 01/23/16  Yes Myli Pae, PA-C  ondansetron (ZOFRAN-ODT) 4 MG disintegrating tablet Take 1-2 tablets (4-8 mg total) by mouth every 8 (eight) hours as needed for nausea or vomiting. 11/28/15  Yes Emi Belfast, FNP  spironolactone (ALDACTONE) 100 MG tablet Take 2 tablets  (200 mg total) by mouth once. 01/23/16  Yes Keanan Melander, PA-C  Syringe/Needle, Disp, (SYRINGE 3CC/22GX1-1/2") 22G X 1-1/2" 3 ML MISC Use as directed 01/23/16  Yes Kewon Statler, PA-C  mupirocin ointment (BACTROBAN) 2 %  02/18/16   Historical Provider, MD    Patient Active Problem List   Diagnosis Date Noted  . Hepatitis C antibody test positive 03/08/2015  . 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 Gender identity disorder 03/08/2015  . Gender dysphoria 03/08/2015  . Hormone replacement therapy (HRT) 03/07/2015  . Leukocytosis 01/30/2015  . Anoxic brain injury (HCC) 01/12/2015  . 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 Benign essential HTN 01/12/2015  . GERD (gastroesophageal reflux disease) 01/12/2015  . Breast pain 01/12/2015  . Tinnitus 01/12/2015  . Edentulous 01/12/2015  . BMI 30.0-30.9,adult 01/12/2015  . Smoker 01/12/2015  . Bipolar 1 disorder [facility])      Physical Exam  Constitutional: She is oriented to person, place, and time. She appears well-developed and well-nourished. She is active and cooperative. No distress.  BP 116/74 (BP Location: Right Arm, Patient Position: Sitting, Cuff Size: Normal)   Pulse 90   Temp 98.3 F (36.8 C) (Oral)   Resp 18   Ht  (1.753 m)   Wt 208 lb 3.2 oz (94.4 kg)   SpO2 96%   BMI 30.75 kg/m    HENT:  Head:    Eyes: Conjunctivae are normal.  Pulmonary/Chest: Effort normal. Right breast exhibits no inverted nipple, no mass, no nipple discharge, no skin change and no tenderness. Left breast exhibits skin change and tenderness. Left breast exhibits no inverted nipple, no mass and no nipple discharge. Breasts are asymmetrical.    Both breasts are augmented. The LEFT breast is more firm to the touch, is tender, and there is an area of mild erythema on the underside. There is no drainage, lesion or increased warmth.  Neurological: She is alert and oriented to person, place, and time.  Skin: Skin is warm and dry. No rash noted.  Well-healed  surgical scar across the forehead and behind both ears. Staples close the wound, with prolene sutures only around the ears. There is no erythema or edema. No drainage. Staples and sutures removed without incident.  Psychiatric: She has a normal mood and affect. Her speech is normal and behavior is normal.      ASSESSMENT & PLAN:  1. Encounter for removal of sutures Sutures and staples removed without incident. I do not believe that she needs additional antibiotics. She can ask the surgeon to send me the report of the sample that showed the infection, if desired. Warm compresses may help. As she intends to establish with a local plastic surgeon for future procedures, I will refer, if needed, once she lets me know where she plans to go.   Fernande Bras, PA-C Physician Assistant-Certified [facility]

## 2016-02-27 NOTE — Patient Instructions (Addendum)
Contact your insurance plan. Ask them to send me the form needed to document the need for the surgery due to pain. Ask them which plastic surgeons are covered in the network, and schedule with the one you prefer to see.  STOP the oral antibiotics.  Continue the topical antibiotic ointment around the ears until there is no more crusting, then switch to the scar ointment.    IF you received an x-ray today, you will receive an invoice from [facility]. Please contact [facility] at [phone] with questions or concerns regarding your invoice.   IF you received labwork today, you will receive an invoice from [employer]. Please contact [facility] at [phone] with questions or concerns regarding your invoice.   Our billing staff will not be able to assist you with questions regarding bills from these companies.  You will be contacted with the lab results as soon as they are available. The fastest way to get your results is to activate your My Chart account. Instructions are located on the last page of this paperwork. If you have not heard from [place] regarding the results in 2 weeks, please contact this office.

## 2016-02-29 ENCOUNTER — Telehealth: Payer: Self-pay

## 2016-02-29 NOTE — Telephone Encounter (Signed)
Voicemail was left on referrals VM by Taneka with [employer] stating that patient is asking for a case to be opened for her to receive some type of care management. [employer] does not initiate  those type of request, the department that handles that is Care Management and their number is [phone] it can only be done by the patient's doctor calling in to speak with someone on that team about the request. I saw no notes or anything concerning this subject.

## 2016-02-29 NOTE — Telephone Encounter (Signed)
CHelle do you know what this is about?

## 2016-02-29 NOTE — Telephone Encounter (Signed)
No, she did not mention this to me. She was going to contact her insurance plan to find out which local plastic surgeons are in her network so I can refer her.  What type of care management is she needing? I believe that she is pretty independent, is capable of managing finances and her home.

## 2016-03-01 ENCOUNTER — Telehealth: Payer: Self-pay

## 2016-03-01 NOTE — Telephone Encounter (Addendum)
The patient called to discuss her post-operative medications.  She had a reaction to the second medication, and she is hesitant to take anything else that has been prescribed.  She was first prescribed cephalexin 500mg  for her facial surgery.  She said she did not have any reaction to that.  The surgeon said that she had an infection in her breast and needed something stronger, so he prescribed Cefadroxil 500mg , and she broke out in a rash and was itching horribly.  So she stopped taking it.  She was then prescribed azithromycin 250mg , but she has not taken any of this because she is worried about having another reaction.    She would like Chelle's advise on how to proceed, since Chelle knows her medical history.  She said that she knows she needs to be taking an antibiotic to prevent post-operative infection, but she is afraid of having another reaction.  CB#: [phone]

## 2016-03-02 NOTE — Telephone Encounter (Signed)
Based on my examination, I do not believe that she needs any additional antibiotics at this time.  If the area under the LEFT breast becomes more tender, swollen, warm to the touch or red, let me know. Otherwise, I recommend warm compresses and supportive bra.

## 2016-03-03 NOTE — Telephone Encounter (Signed)
I called and they hung up on me. I will try again.

## 2016-03-04 ENCOUNTER — Telehealth: Payer: Self-pay

## 2016-03-04 NOTE — Telephone Encounter (Signed)
Patient would like to have Chelle call her personally because she hasnt heard from her and she was expecting a call back over a week ago and no one has contacted her. I did read the message to her that was put in but she still wants to talk to Chelle.  Also she stated that she contacted the insurance company and they told her that Chelle would have to contact them personally to get the correct paperwork to get her insurance to cover getting her breast repaired from the botched job that was done and the scar tissue that needs to be removed.  Her call back number is [phone]

## 2016-03-04 NOTE — Telephone Encounter (Signed)
Anita Sparks spoke with pt about this.

## 2016-03-04 NOTE — Telephone Encounter (Signed)
I spoke with [employer], initially someone in Provider Relations and then Utilization Management.  I confirmed that the patient does not need a referral to see a specialist and that prior authorization for the procedure should be requested by the provider performing the service and include the CPT code(s).  I see that the patient was contacted x 2 to relay the reply to her phone call several days ago, and that she either did not answer the phone, or answered and hung up.  When speaking to the patient, I advised her of the above, including repeated attempts by the staff to reach her.  I reiterated her need to select a plastic surgeon locally, ideally one in network with [employer], and to schedule with that provider. THAT provider will then need to submit information, including CPT code(s) and medical facts supporting the need for the procedure.  In addition, I advised her that while I do not believe that there is infection, since the plastic surgeon thought so, she can take the azithromycin he prescribed. We do not know whether or not she may experience adverse effects from the medication.

## 2016-03-04 NOTE — Telephone Encounter (Signed)
I called again today, I keep getting the run around. I left another message for a return call so I can figure out what we need to do for the procedure to be covered.

## 2016-04-07 ENCOUNTER — Other Ambulatory Visit: Payer: Self-pay | Admitting: Family Medicine

## 2016-04-07 DIAGNOSIS — R110 Nausea: Secondary | ICD-10-CM

## 2016-05-07 ENCOUNTER — Telehealth: Payer: Self-pay

## 2016-05-07 NOTE — Telephone Encounter (Signed)
Patient requesting Xanax refill    Best phone is [phone]   Pharmacy [facility]

## 2016-05-08 ENCOUNTER — Other Ambulatory Visit: Payer: Self-pay | Admitting: Physician Assistant

## 2016-05-08 DIAGNOSIS — F411 Generalized anxiety disorder: Secondary | ICD-10-CM

## 2016-05-09 MED ORDER — ALPRAZOLAM 1 MG PO TABS: 1.0000 mg | ORAL_TABLET | Freq: Three times a day (TID) | ORAL | 0 refills | Status: DC

## 2016-05-09 MED ORDER — ALPRAZOLAM 1 MG PO TABS: 1.0000 mg | ORAL_TABLET | Freq: Three times a day (TID) | ORAL | 0 refills | Status: DC | PRN

## 2016-05-09 NOTE — Telephone Encounter (Signed)
Meds ordered this encounter  Medications  . ALPRAZolam (XANAX) 1 MG tablet    Sig: Take 1 tablet (1 mg total) by mouth 3 (three) times daily as needed for anxiety. for anxiety    Dispense:  90 tablet    Refill:  0    Order Specific Question:   Supervising Provider    Answer:   Clelia CroftSHAW, EVA N [[id number]]  . ALPRAZolam (XANAX) 1 MG tablet    Sig: Take 1 tablet (1 mg total) by mouth 3 (three) times daily as needed for anxiety.    Dispense:  90 tablet    Refill:  0    May fill 30 days after date on prescription    Order Specific Question:   Supervising Provider    Answer:   Clelia CroftSHAW, EVA N [[id number]]  . ALPRAZolam (XANAX) 1 MG tablet    Sig: Take 1 tablet (1 mg total) by mouth 3 (three) times daily.    Dispense:  90 tablet    Refill:  0    May fill 60 days after date on prescription    Order Specific Question:   Supervising Provider    Answer:   Clelia CroftSHAW, EVA N [[id number]]

## 2016-05-09 NOTE — Telephone Encounter (Signed)
Last OV 6/21 and was given 3 mos of RFs then.

## 2016-05-12 NOTE — Telephone Encounter (Signed)
Faxed

## 2016-05-13 NOTE — Telephone Encounter (Signed)
Notified pt these Rxs were sent to pharm already.

## 2016-06-05 ENCOUNTER — Telehealth: Payer: Self-pay | Admitting: Family Medicine

## 2016-06-05 NOTE — Telephone Encounter (Signed)
Pt was calling to see what her blood type was and I ask the lab they said that we never did that test to find that out I let the pt know that pt understood

## 2016-06-06 ENCOUNTER — Telehealth: Payer: Self-pay

## 2016-06-06 NOTE — Telephone Encounter (Signed)
[facility] to run lisinopril due to interaction with other medications please call [phone]

## 2016-06-06 NOTE — Telephone Encounter (Signed)
[facility]   [facility] [place]  [phone]  Completely out of xanax

## 2016-06-07 NOTE — Telephone Encounter (Signed)
Pt is on travel and needs a [place]:  [place] [facility] [phone] (temporarily while on Vacation)  [phone]

## 2016-06-12 NOTE — Telephone Encounter (Signed)
Checked with pharm and they stated that everything was transferred out of there [facility] [place] and all is OK. Nothing further is needed.

## 2016-06-25 ENCOUNTER — Telehealth: Payer: Self-pay

## 2016-06-25 NOTE — Telephone Encounter (Signed)
Anita Sparks   Left implant is working its way out of the skin.  Anita Sparks will be going to the ER tomorrow to have it removed.   Needs you to contact the insurance company for Hyrdocone pain medication.  Mentioned muscle relaxer   [phone]

## 2016-06-25 NOTE — Telephone Encounter (Signed)
Chelle, I'll forward this to you so you are aware. I will have to check on what would be needed for ins comp on Friday, but if it is Medicaid, you should be able to write in 2 wk amounts per Rx? Since this was marked high priority and pt wanted you to see this, I'll send it now.

## 2016-06-25 NOTE — Telephone Encounter (Signed)
The emergency department provider will prescribe what is recommended while she is there. I will be back in the office on Friday 11/24 for follow-up if needed.

## 2016-06-27 ENCOUNTER — Telehealth: Payer: Self-pay

## 2016-06-27 NOTE — Telephone Encounter (Signed)
LMOM to pt w/Chelle's message.

## 2016-06-27 NOTE — Telephone Encounter (Signed)
I TRIED TO ATTACH THIS MESSAGE WITH THE LAST MESSAGE BUT IT DID NOT EXCEPT IT. PATIENT WOULD LIKE CHELLE TO KNOW THAT SHE DOES NOT HAVE TO CALL HER INSURANCE COMPANY. SHE WILL JUST PAY FOR THE MEDICATION OUT-OF-POCKET. SHE TOLD THE PLASTIC SURGEON THAT SHE IS IN TERRIBLE PAIN. SHE SAID CHELLE WILL KNOW WHAT SHE IS TALKING ABOUT. BEST PHONE [phone] (SHE SAID THIS IS A PHONE APP NUMBER) MBC

## 2016-06-30 ENCOUNTER — Encounter (HOSPITAL_COMMUNITY): Payer: Self-pay | Admitting: *Deleted

## 2016-06-30 ENCOUNTER — Telehealth: Payer: Self-pay | Admitting: Emergency Medicine

## 2016-06-30 ENCOUNTER — Emergency Department (HOSPITAL_COMMUNITY)
Admission: EM | Admit: 2016-06-30 | Discharge: 2016-06-30 | Payer: PPO | Attending: Emergency Medicine | Admitting: Emergency Medicine

## 2016-06-30 ENCOUNTER — Telehealth: Payer: Self-pay

## 2016-06-30 DIAGNOSIS — N6489 Other specified disorders of breast: Secondary | ICD-10-CM | POA: Diagnosis not present

## 2016-06-30 DIAGNOSIS — Z7982 Long term (current) use of aspirin: Secondary | ICD-10-CM | POA: Diagnosis not present

## 2016-06-30 DIAGNOSIS — F17200 Nicotine dependence, unspecified, uncomplicated: Secondary | ICD-10-CM | POA: Diagnosis not present

## 2016-06-30 DIAGNOSIS — T8541XA Breakdown (mechanical) of breast prosthesis and implant, initial encounter: Secondary | ICD-10-CM | POA: Diagnosis not present

## 2016-06-30 DIAGNOSIS — T8579XA Infection and inflammatory reaction due to other internal prosthetic devices, implants and grafts, initial encounter: Secondary | ICD-10-CM | POA: Diagnosis not present

## 2016-06-30 DIAGNOSIS — Y812 Prosthetic and other implants, materials and accessory general- and plastic-surgery devices associated with adverse incidents: Secondary | ICD-10-CM | POA: Insufficient documentation

## 2016-06-30 DIAGNOSIS — Z79899 Other long term (current) drug therapy: Secondary | ICD-10-CM | POA: Diagnosis not present

## 2016-06-30 DIAGNOSIS — T85898A Other specified complication of other internal prosthetic devices, implants and grafts, initial encounter: Secondary | ICD-10-CM | POA: Diagnosis not present

## 2016-06-30 DIAGNOSIS — L929 Granulomatous disorder of the skin and subcutaneous tissue, unspecified: Secondary | ICD-10-CM | POA: Diagnosis not present

## 2016-06-30 DIAGNOSIS — Z87891 Personal history of nicotine dependence: Secondary | ICD-10-CM | POA: Insufficient documentation

## 2016-06-30 DIAGNOSIS — I10 Essential (primary) hypertension: Secondary | ICD-10-CM | POA: Diagnosis not present

## 2016-06-30 DIAGNOSIS — Z45812 Encounter for adjustment or removal of left breast implant: Secondary | ICD-10-CM | POA: Diagnosis not present

## 2016-06-30 DIAGNOSIS — Y834 Other reconstructive surgery as the cause of abnormal reaction of the patient, or of later complication, without mention of misadventure at the time of the procedure: Secondary | ICD-10-CM | POA: Diagnosis not present

## 2016-06-30 DIAGNOSIS — T8549XA Other mechanical complication of breast prosthesis and implant, initial encounter: Secondary | ICD-10-CM | POA: Diagnosis not present

## 2016-06-30 DIAGNOSIS — L03313 Cellulitis of chest wall: Secondary | ICD-10-CM | POA: Diagnosis not present

## 2016-06-30 DIAGNOSIS — Z91011 Allergy to milk products: Secondary | ICD-10-CM | POA: Diagnosis not present

## 2016-06-30 DIAGNOSIS — R110 Nausea: Secondary | ICD-10-CM

## 2016-06-30 DIAGNOSIS — Z888 Allergy status to other drugs, medicaments and biological substances status: Secondary | ICD-10-CM | POA: Diagnosis not present

## 2016-06-30 DIAGNOSIS — N6032 Fibrosclerosis of left breast: Secondary | ICD-10-CM | POA: Diagnosis not present

## 2016-06-30 LAB — CBC WITH DIFFERENTIAL/PLATELET
Basophils Absolute: 0.1 10*3/uL (ref 0.0–0.1)
Basophils Relative: 0 %
Eosinophils Absolute: 0.3 10*3/uL (ref 0.0–0.7)
Eosinophils Relative: 2 %
HCT: 41.3 % (ref 36.0–46.0)
Hemoglobin: 14.4 g/dL (ref 12.0–15.0)
Lymphocytes Relative: 29 %
Lymphs Abs: 5.2 10*3/uL — ABNORMAL HIGH (ref 0.7–4.0)
MCH: 32.5 pg (ref 26.0–34.0)
MCHC: 34.9 g/dL (ref 30.0–36.0)
MCV: 93.2 fL (ref 78.0–100.0)
Monocytes Absolute: 1.1 10*3/uL — ABNORMAL HIGH (ref 0.1–1.0)
Monocytes Relative: 6 %
Neutro Abs: 11.2 10*3/uL — ABNORMAL HIGH (ref 1.7–7.7)
Neutrophils Relative %: 63 %
Platelets: 317 10*3/uL (ref 150–400)
RBC: 4.43 MIL/uL (ref 3.87–5.11)
RDW: 13.1 % (ref 11.5–15.5)
WBC: 17.8 10*3/uL — ABNORMAL HIGH (ref 4.0–10.5)

## 2016-06-30 LAB — BASIC METABOLIC PANEL
Anion gap: 8 (ref 5–15)
BUN: 16 mg/dL (ref 6–20)
CO2: 26 mmol/L (ref 22–32)
Calcium: 9 mg/dL (ref 8.9–10.3)
Chloride: 99 mmol/L — ABNORMAL LOW (ref 101–111)
Creatinine, Ser: 0.86 mg/dL (ref 0.44–1.00)
GFR calc Af Amer: >60 mL/min (ref >60)
GFR calc non Af Amer: >60 mL/min (ref >60)
Glucose, Bld: 106 mg/dL — ABNORMAL HIGH (ref 65–99)
Potassium: 3.9 mmol/L (ref 3.5–5.1)
Sodium: 133 mmol/L — ABNORMAL LOW (ref 135–145)

## 2016-06-30 MED ORDER — ONDANSETRON 4 MG PO TBDP: 4.0000 mg | ORAL_TABLET | Freq: Three times a day (TID) | ORAL | 0 refills | Status: DC | PRN

## 2016-06-30 MED ORDER — MORPHINE SULFATE (PF) 4 MG/ML IV SOLN
4.0000 mg | Freq: Once | INTRAVENOUS | Status: AC
  Administered 2016-06-30: 4 mg via INTRAVENOUS
  Filled 2016-06-30: qty 1

## 2016-06-30 MED ORDER — VANCOMYCIN HCL IN DEXTROSE 1-5 GM/200ML-% IV SOLN
1000.0000 mg | Freq: Once | INTRAVENOUS | Status: AC
  Administered 2016-06-30: 1000 mg via INTRAVENOUS
  Filled 2016-06-30: qty 200

## 2016-06-30 NOTE — ED Triage Notes (Signed)
Pt states that 3 days ago her left breast implant began to work its way out; pt states that this am she began having purulent drainage; pt states that in Sept she was supposed to have a "capsule" removed the left breast and states that they were unable to due to an infection; pt states that [place] plastic [place] in [place] and she is unable to return to [place] to have it repaired; pt states that she needs the pain and the infection taken care and the implant removed; pt states that the original breast implants were done in 2004

## 2016-06-30 NOTE — Telephone Encounter (Signed)
Patient called and requested that you review her ED notes [facility].  She went there at 2:00am because her implant was poking through the side. She got IV antibiotics and morphine in the ED.  They wanted to transfer [facility] by ambulance, however, she declined due to cost. She stated that she is at home now and is very nauseated due to the morphine.  She would like you to call in some nausea medication [facility] [place].  [facility].  Her best number is [phone] if you need to contact her.

## 2016-06-30 NOTE — Telephone Encounter (Signed)
I am extremely concerned that she is not ALREADY at [facility]. Based on the ED notes, she was to be ADMITTED to the hospital there. She needs to go there NOW. I've sent in a couple of tablets of ondansetron, but just to help long enough for [facility] to get [facility].

## 2016-06-30 NOTE — Discharge Instructions (Signed)
Go straight to the [facility] ER

## 2016-06-30 NOTE — Telephone Encounter (Signed)
Attempted to reach patient Contact number listed unable to accept messages at this time. Will try again

## 2016-06-30 NOTE — ED Provider Notes (Signed)
[facility]-EMERGENCY DEPT Provider Note   CSN: [id number] Arrival date & time: 06/30/16  0225     History   Chief Complaint Chief Complaint  Patient presents with  . Breast Implant Complication    HPI Anita Sparks is a 50 y.o. female.  HPI Patient presents to the emergency department with with a left breast implant infection.  The patient has an open wound at the inferior aspect of her breast.  Patient states that this opened up several days ago and started draining.  She took antibiotics.  It initially started as a discolored area.  Patient states that [place] who advised her to come to the emergency department emergently.  Patient states that nothing seems make the condition better, still is having increasing pain throughout the breast.  Patient states that she did not take any other medications other than the antibiotics that she had left over from a previous infectionThe patient denies chest pain, shortness of breath, headache,blurred vision, neck pain, fever, cough, weakness, numbness, dizziness, anorexia, edema, abdominal pain, nausea, vomiting, diarrhea, rash, back pain, dysuria, hematemesis, bloody stool, near syncope, or syncope. Past Medical History:  Diagnosis Date  . Anoxic brain injury (HCC)    infancy  . Anxiety   . Anxiety   . Bipolar 1 disorder (HCC)   . Depression   . H/O clavicle fracture 10/2011   LEFT; not repaired  . Self-mutilation    cutting, LEFT forearm. Not since 2006  . Transgendered     Patient Active Problem List   Diagnosis Date Noted  . Hepatitis C antibody test positive 03/08/2015  . 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 Gender identity disorder 03/08/2015  . Gender dysphoria 03/08/2015  . Hormone replacement therapy (HRT) 03/07/2015  . Leukocytosis 01/30/2015  . Anoxic brain injury (HCC) 01/12/2015  . 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 Benign essential HTN 01/12/2015  . GERD (gastroesophageal reflux disease) 01/12/2015    . Breast pain 01/12/2015  . Tinnitus 01/12/2015  . Edentulous 01/12/2015  . BMI 30.0-30.9,adult 01/12/2015  . Smoker 01/12/2015  . Bipolar 1 disorder [facility](HCC)     Past Surgical History:  Procedure Laterality Date  . BREAST SURGERY     augmentation  . FACIAL RECONSTRUCTION SURGERY  04/17/2015  . HERNIA REPAIR  1980   hiatal  . MYRINGOTOMY    . TONSILLECTOMY      OB History    No data available       Home Medications    Prior to Admission medications   Medication Sig Start Date End Date Taking? Authorizing Provider  ALPRAZolam Prudy Feeler(XANAX) 1 MG tablet Take 1 tablet (1 mg total) by mouth 3 (three) times daily. 05/09/16  Yes Chelle Jeffery, PA-C  aspirin 81 MG tablet Take 81 mg by mouth daily.   Yes Historical Provider, MD  cefadroxil (DURICEF) 500 MG capsule Take 500 mg by mouth 2 (two) times daily.   Yes Historical Provider, MD  Cholecalciferol (VITAMIN D PO) Take 1 tablet by mouth daily.   Yes Historical Provider, MD  estradiol cypionate (DEPO-ESTRADIOL) 5 MG/ML injection 2.24 mL IM Q 13 days Patient taking differently: 2.14 mL IM Q 13 days 11/28/15  Yes Emi Belfasteborah B Gessner, FNP  FLUoxetine (PROZAC) 20 MG tablet Take 2 tablets (40 mg total) by mouth daily. 01/23/16  Yes Chelle Jeffery, PA-C  ibuprofen (ADVIL,MOTRIN) 200 MG tablet Take 800 mg by mouth every 6 (six) hours as needed for moderate pain.   Yes Historical Provider, MD  lisinopril (PRINIVIL,ZESTRIL) 10 MG tablet Take 1 tablet (10 mg total) by mouth daily. 01/23/16  Yes Chelle Jeffery, PA-C  Multiple Vitamin (MULTIVITAMIN WITH MINERALS) TABS tablet Take 1 tablet by mouth daily.   Yes Historical Provider, MD  omeprazole (PRILOSEC) 40 MG capsule Take 1 capsule (40 mg total) by mouth daily. 01/23/16  Yes Chelle Jeffery, PA-C  spironolactone (ALDACTONE) 100 MG tablet Take 2 tablets (200 mg total) by mouth once. Patient taking differently: Take 200 mg by mouth daily.  01/23/16  Yes Chelle Jeffery, PA-C  ALPRAZolam (XANAX) 1 MG tablet  Take 1 tablet (1 mg total) by mouth 3 (three) times daily as needed for anxiety. for anxiety Patient not taking: Reported on 06/30/2016 05/09/16   Porfirio Oar, PA-C  ALPRAZolam Prudy Feeler) 1 MG tablet Take 1 tablet (1 mg total) by mouth 3 (three) times daily as needed for anxiety. Patient not taking: Reported on 06/30/2016 05/09/16   Porfirio Oar, PA-C  cyclobenzaprine (FLEXERIL) 10 MG tablet Take 1 tablet (10 mg total) by mouth at bedtime as needed for muscle spasms. Patient not taking: Reported on 06/30/2016 12/05/15   Emi Belfast, FNP  ondansetron (ZOFRAN-ODT) 4 MG disintegrating tablet Take 1-2 tablets (4-8 mg total) by mouth every 8 (eight) hours as needed for nausea or vomiting. Patient not taking: Reported on 06/30/2016 11/28/15   Emi Belfast, FNP  Syringe/Needle, Disp, (SYRINGE 3CC/22GX1-1/2") 22G X 1-1/2" 3 ML MISC Use as directed Patient not taking: Reported on 06/30/2016 01/23/16   Porfirio Oar, PA-C    Family History Family History  Problem Relation Age of Onset  . Heart disease Father     Social History Social History  Substance Use Topics  . Smoking status: Former Games developer  . Smokeless tobacco: Never Used  . Alcohol use No     Allergies   Lactose intolerance (gi) and Wellbutrin [bupropion]   Review of Systems Review of Systems All other systems negative except as documented in the HPI. All pertinent positives and negatives as reviewed in the HPI.  Physical Exam Updated Vital Signs BP 136/86 (BP Location: Left Arm)   Pulse 92   Temp 97.7 F (36.5 C) (Oral)   Resp 16   Ht 5\' 9"  (1.753 m)   Wt 83 kg   SpO2 94%   BMI 27.02 kg/m   Physical Exam  Constitutional: She is oriented to person, place, and time. She appears well-developed and well-nourished. No distress.  HENT:  Head: Normocephalic and atraumatic.  Mouth/Throat: Oropharynx is clear and moist.  Eyes: Pupils are equal, round, and reactive to light.  Neck: Normal range of motion. Neck  supple.  Cardiovascular: Normal rate, regular rhythm and normal heart sounds.  Exam reveals no gallop and no friction rub.   No murmur heard. Pulmonary/Chest: Effort normal and breath sounds normal. No respiratory distress. She has no wheezes.  patient has an open area under the left breast on the inferior aspect.  There is increased redness around the area with crusting but no noted purulence.  There is a breast implant noted in the central part of this open area Abdominal: Soft. Bowel sounds are normal. She exhibits no distension. There is no tenderness.  Neurological: She is alert and oriented to person, place, and time. She exhibits normal muscle tone. Coordination normal.  Skin: Skin is warm and dry. No rash noted. No erythema.  Psychiatric: She has a normal mood and affect. Her behavior is normal.  Nursing note and vitals reviewed.  ED Treatments / Results  Labs (all labs ordered are listed, but only abnormal results are displayed) Labs Reviewed  BASIC METABOLIC PANEL - Abnormal; Notable for the following:       Result Value   Sodium 133 (*)    Chloride 99 (*)    Glucose, Bld 106 (*)    All other components within normal limits  CBC WITH DIFFERENTIAL/PLATELET - Abnormal; Notable for the following:    WBC 17.8 (*)    Neutro Abs 11.2 (*)    Lymphs Abs 5.2 (*)    Monocytes Absolute 1.1 (*)    All other components within normal limits    EKG  EKG Interpretation None       Radiology No results found.  Procedures Procedures (including critical care time)  Medications Ordered in ED Medications  morphine 4 MG/ML injection 4 mg (4 mg Intravenous Given 06/30/16 0323)  vancomycin (VANCOCIN) IVPB 1000 mg/200 mL premix (0 mg Intravenous Stopped 06/30/16 0527)  morphine 4 MG/ML injection 4 mg (4 mg Intravenous Given 06/30/16 0457)     Initial Impression / Assessment and Plan / ED Course  I have reviewed the triage vital signs and the nursing notes.  Pertinent labs &  imaging results that were available during my care of the patient were reviewed by me and considered in my medical decision making (see chart for details).  Clinical Course     The patient has not seen anyone locally for this issue.  I spoke with the on-call plastic surgeon to [facility] who agrees to take the patient in transfer.  Patient is made aware the plan and all questions were answered.  She was given IV antibiotics here in the emergency department along with pain control  Final Clinical Impressions(s) / ED Diagnoses   Final diagnoses:  None    New Prescriptions New Prescriptions   No medications on file     Charlestine NightChristopher Alyvia Derk, PA-C 06/30/16 0559    Gilda Creasehristopher J Pollina, MD 06/30/16 [phone]

## 2016-07-01 DIAGNOSIS — Z45812 Encounter for adjustment or removal of left breast implant: Secondary | ICD-10-CM | POA: Diagnosis not present

## 2016-07-01 DIAGNOSIS — T8541XA Breakdown (mechanical) of breast prosthesis and implant, initial encounter: Secondary | ICD-10-CM | POA: Diagnosis not present

## 2016-07-02 MED ORDER — ONDANSETRON 4 MG PO TBDP: 4.0000 mg | ORAL_TABLET | Freq: Three times a day (TID) | ORAL | 0 refills | Status: DC | PRN

## 2016-07-02 NOTE — Telephone Encounter (Signed)
Reached pt who reported that [facility] and is just now being DCd. They performed surgery and there was no infection. She stated that it had become unseated and was in wrong position. Stated she did not needs only #2 zofran for nausea caused by morphine, she needs her regular Rx to take regularly to combat the nausea caused by her psych med. Chelle, I see that we have Rxd this several times for pt but never w/RFs. Please advise.

## 2016-07-02 NOTE — Telephone Encounter (Signed)
Meds ordered this encounter  Medications  . ondansetron (ZOFRAN-ODT) 4 MG disintegrating tablet    Sig: Take 1-2 tablets (4-8 mg total) by mouth every 8 (eight) hours as needed for nausea or vomiting.    Dispense:  60 tablet    Refill:  0

## 2016-07-04 NOTE — Telephone Encounter (Signed)
Chelle, FYI. I'm not sure if pt is talking about the hydrocodone, or zofran. I have not gotten a PA req for either but not needed now anyway.

## 2016-07-07 NOTE — Telephone Encounter (Signed)
Please contact patient for clarification. I sent in #60 Zofran last week, so it may be the hydrocodone, but the original message was on 11/24, so this may have already been addressed.  The prior authorization issue was from before her ED visit, so I don't think it's that.  Her plastic surgeon needs to address the pain related to whatever s/he did. Based on the notes I can see, she has an infection in the breast and is on antibiotics.

## 2016-07-29 ENCOUNTER — Other Ambulatory Visit: Payer: Self-pay

## 2016-07-29 DIAGNOSIS — F411 Generalized anxiety disorder: Secondary | ICD-10-CM

## 2016-07-29 NOTE — Telephone Encounter (Signed)
PATIENT WOULD LIKE CHELLE TO KNOW THAT IT IS TIME TO GET A REFILL ON THE ALPRAZOLAM 1 MG.  BEST PHONE [phone] (CELL)  PHARMACY CHOICE IS [facility] ON [address].  MBC

## 2016-07-30 MED ORDER — ALPRAZOLAM 1 MG PO TABS: 1.0000 mg | ORAL_TABLET | Freq: Three times a day (TID) | ORAL | 0 refills | Status: DC | PRN

## 2016-07-30 NOTE — Telephone Encounter (Signed)
Meds ordered this encounter  Medications  . ALPRAZolam (XANAX) 1 MG tablet    Sig: Take 1 tablet (1 mg total) by mouth 3 (three) times daily as needed for anxiety. for anxiety    Dispense:  90 tablet    Refill:  0    Please advise this patient that she needs OV with me for the next refill.

## 2016-07-30 NOTE — Telephone Encounter (Signed)
05/09/16  last refill 02/27/16 last ov

## 2016-07-31 NOTE — Telephone Encounter (Signed)
Faxed to [facility]

## 2016-09-04 ENCOUNTER — Other Ambulatory Visit: Payer: Self-pay | Admitting: Physician Assistant

## 2016-09-04 ENCOUNTER — Other Ambulatory Visit: Payer: Self-pay

## 2016-09-04 DIAGNOSIS — K219 Gastro-esophageal reflux disease without esophagitis: Secondary | ICD-10-CM

## 2016-09-04 DIAGNOSIS — Z79890 Hormone replacement therapy (postmenopausal): Secondary | ICD-10-CM

## 2016-09-04 DIAGNOSIS — F339 Major depressive disorder, recurrent, unspecified: Secondary | ICD-10-CM

## 2016-09-04 DIAGNOSIS — I10 Essential (primary) hypertension: Secondary | ICD-10-CM

## 2016-09-04 DIAGNOSIS — F411 Generalized anxiety disorder: Secondary | ICD-10-CM

## 2016-09-04 NOTE — Telephone Encounter (Signed)
01/2016 last ov 

## 2016-09-04 NOTE — Telephone Encounter (Signed)
Pt is needing to get a refill on all of his meds called in to the pharmacy [facility] [address]   He states that he is out complerely out of prilosec xanax and aldactone   But he does need a refill on all meds for at least 3 months he is moving and will need to get established with a provider in the new area  Best number [phone]

## 2016-09-05 MED ORDER — OMEPRAZOLE 40 MG PO CPDR: 40.0000 mg | DELAYED_RELEASE_CAPSULE | Freq: Every day | ORAL | 1 refills | Status: DC

## 2016-09-05 MED ORDER — SPIRONOLACTONE 100 MG PO TABS: 200.0000 mg | ORAL_TABLET | Freq: Once | ORAL | 1 refills | Status: DC

## 2016-09-05 MED ORDER — ALPRAZOLAM 1 MG PO TABS: 1.0000 mg | ORAL_TABLET | Freq: Three times a day (TID) | ORAL | 0 refills | Status: DC | PRN

## 2016-09-05 MED ORDER — ALPRAZOLAM 1 MG PO TABS: 1.0000 mg | ORAL_TABLET | Freq: Three times a day (TID) | ORAL | 0 refills | Status: DC

## 2016-09-05 NOTE — Telephone Encounter (Signed)
Pt will pick up alprazolam rx and asked me to send in spironolactone and prilosec

## 2016-09-05 NOTE — Telephone Encounter (Signed)
This patient identifies as FEMALE.  Should have refills on everything not-controlled at the pharmacy for 1 year from 01/2016.  Meds ordered this encounter  Medications  . ALPRAZolam (XANAX) 1 MG tablet    Sig: Take 1 tablet (1 mg total) by mouth 3 (three) times daily as needed for anxiety.    Dispense:  90 tablet    Refill:  0    May fill 30 days after date on prescription    Order Specific Question:   Supervising Provider    Answer:   Clelia CroftSHAW, EVA N [[id number]]  . ALPRAZolam (XANAX) 1 MG tablet    Sig: Take 1 tablet (1 mg total) by mouth 3 (three) times daily.    Dispense:  90 tablet    Refill:  0    May fill 60 days after date on prescription    Order Specific Question:   Supervising Provider    Answer:   Clelia CroftSHAW, EVA N [[id number]]  . ALPRAZolam (XANAX) 1 MG tablet    Sig: Take 1 tablet (1 mg total) by mouth 3 (three) times daily as needed for anxiety. for anxiety    Dispense:  90 tablet    Refill:  0    Order Specific Question:   Supervising Provider    Answer:   Clelia CroftSHAW, EVA N [[id number]]

## 2016-09-11 ENCOUNTER — Other Ambulatory Visit: Payer: Self-pay

## 2016-09-11 DIAGNOSIS — I10 Essential (primary) hypertension: Secondary | ICD-10-CM

## 2016-09-11 DIAGNOSIS — F32A Depression, unspecified: Secondary | ICD-10-CM

## 2016-09-11 DIAGNOSIS — F329 Major depressive disorder, single episode, unspecified: Secondary | ICD-10-CM

## 2016-09-11 DIAGNOSIS — R110 Nausea: Secondary | ICD-10-CM

## 2016-09-11 DIAGNOSIS — Z79890 Hormone replacement therapy (postmenopausal): Secondary | ICD-10-CM

## 2016-09-11 DIAGNOSIS — K219 Gastro-esophageal reflux disease without esophagitis: Secondary | ICD-10-CM

## 2016-09-11 MED ORDER — OMEPRAZOLE 40 MG PO CPDR: 40.0000 mg | DELAYED_RELEASE_CAPSULE | Freq: Every day | ORAL | 0 refills | Status: DC

## 2016-09-11 MED ORDER — FLUOXETINE HCL 20 MG PO TABS: 40.0000 mg | ORAL_TABLET | Freq: Every day | ORAL | 0 refills | Status: DC

## 2016-09-11 MED ORDER — ONDANSETRON 4 MG PO TBDP: 4.0000 mg | ORAL_TABLET | Freq: Three times a day (TID) | ORAL | 0 refills | Status: DC | PRN

## 2016-09-11 MED ORDER — LISINOPRIL 10 MG PO TABS: 10.0000 mg | ORAL_TABLET | Freq: Every day | ORAL | 0 refills | Status: DC

## 2016-09-11 MED ORDER — ESTRADIOL CYPIONATE 5 MG/ML IM OIL: TOPICAL_OIL | INTRAMUSCULAR | 0 refills | Status: DC

## 2016-09-11 MED ORDER — SPIRONOLACTONE 100 MG PO TABS: 200.0000 mg | ORAL_TABLET | Freq: Once | ORAL | 0 refills | Status: DC

## 2016-09-11 NOTE — Telephone Encounter (Signed)
PATIENT WOULD LIKE THIS MESSAGE TO GO TO CHELLE: SHE NEEDS CHELLE TO SEND ALL OF HER MED[facility] [place]. SHE SAID THE [facility] ON [address] WILL NOT TRANSFER THEM AND SAID THEY MIGHT GET TO IT ON Friday. SHE SAID SHE IS OUT OF SOME OF HER MEDICINES AND NEEDS THEM TRANSFERRED AS SOON AS POSSIBLE. BEST PHONE [phone] [facility] [place]. MBC

## 2016-09-11 NOTE — Telephone Encounter (Signed)
I see 09/04/16 your refilled her alprazolam

## 2016-09-11 NOTE — Telephone Encounter (Signed)
Yes, I provided 3 prescriptions for alprazolam on 09/05/16, along with refills of omeprazole and spironolactone.  Meds ordered this encounter  Medications  . estradiol cypionate (DEPO-ESTRADIOL) 5 MG/ML injection    Sig: 2.14 mL IM Q 13 days    Dispense:  15 mL    Refill:  0    Order Specific Question:   Supervising Provider    Answer:   SHAW, EVA N [[id number]]  . FLUoxetine (PROZAC) 20 MG tablet    Sig: Take 2 tablets (40 mg total) by mouth daily.    Dispense:  180 tablet    Refill:  0    Order Specific Question:   Supervising Provider    Answer:   SHAW, EVA N [[id number]]  . lisinopril (PRINIVIL,ZESTRIL) 10 MG tablet    Sig: Take 1 tablet (10 mg total) by mouth daily.    Dispense:  90 tablet    Refill:  0    Order Specific Question:   Supervising Provider    Answer:   SHAW, EVA N [[id number]]  . omeprazole (PRILOSEC) 40 MG capsule    Sig: Take 1 capsule (40 mg total) by mouth daily.    Dispense:  90 capsule    Refill:  0    Order Specific Question:   Supervising Provider    Answer:   SHAW, EVA N [[id number]]  . ondansetron (ZOFRAN-ODT) 4 MG disintegrating tablet    Sig: Take 1-2 tablets (4-8 mg total) by mouth every 8 (eight) hours as needed for nausea or vomiting.    Dispense:  30 tablet    Refill:  0    Order Specific Question:   Supervising Provider    Answer:   SHAW, EVA N [[id number]]  . spironolactone (ALDACTONE) 100 MG tablet    Sig: Take 2 tablets (200 mg total) by mouth once.    Dispense:  180 tablet    Refill:  0    Order Specific Question:   Supervising Provider    Answer:   Clelia CroftSHAW, EVA N [[id number]]

## 2016-09-11 NOTE — Telephone Encounter (Signed)
Faxed today rx to pharmacy

## 2016-10-08 ENCOUNTER — Other Ambulatory Visit: Payer: Self-pay | Admitting: Physician Assistant

## 2016-10-08 DIAGNOSIS — F411 Generalized anxiety disorder: Secondary | ICD-10-CM

## 2016-10-08 NOTE — Telephone Encounter (Signed)
On 09/04/2016 I provided a 3 month supply of this, as she reported she was moving out of town. That supply was to allow her time to establish with another provider local to her. Even if she has not done that, she has prescriptions for this month and next month already.

## 2016-12-08 ENCOUNTER — Telehealth: Payer: Self-pay | Admitting: Physician Assistant

## 2016-12-08 DIAGNOSIS — F411 Generalized anxiety disorder: Secondary | ICD-10-CM

## 2016-12-08 DIAGNOSIS — Z79890 Hormone replacement therapy (postmenopausal): Secondary | ICD-10-CM

## 2016-12-09 ENCOUNTER — Other Ambulatory Visit: Payer: Self-pay | Admitting: Physician Assistant

## 2016-12-09 DIAGNOSIS — F32A Depression, unspecified: Secondary | ICD-10-CM

## 2016-12-09 DIAGNOSIS — K219 Gastro-esophageal reflux disease without esophagitis: Secondary | ICD-10-CM

## 2016-12-09 DIAGNOSIS — R110 Nausea: Secondary | ICD-10-CM

## 2016-12-09 DIAGNOSIS — I10 Essential (primary) hypertension: Secondary | ICD-10-CM

## 2016-12-09 DIAGNOSIS — Z79890 Hormone replacement therapy (postmenopausal): Secondary | ICD-10-CM

## 2016-12-09 DIAGNOSIS — F329 Major depressive disorder, single episode, unspecified: Secondary | ICD-10-CM

## 2016-12-09 NOTE — Telephone Encounter (Signed)
Last seen 01/22/16

## 2016-12-09 NOTE — Telephone Encounter (Signed)
Refills denied, as patient has informed me that she was moving and establishing elsewhere for care. If needs refills from me, will need to schedule a visit.

## 2016-12-09 NOTE — Telephone Encounter (Signed)
Pt states that she is unable to locate a Dr. near her.   She is requesting that you help her find a dr near her.  Pt contact number is [phone]

## 2016-12-11 NOTE — Telephone Encounter (Addendum)
PATIENT IS CALLING TO LET CHELLE KNOW THAT SHE NEEDS REFILLS ON ALL OF HER MEDICATIONS AS SOON AS POSSIBLE. SHE SAID SHE HAS LEFT A MESSAGE FOR CHELLE TO HELP HER WITH FINDING ANOTHER DOCTOR, BUT IN THE MEANTIME, SHE NEEDS ALL OF HER MEDICATIONS NOW. BEST PHONE [phone] (CELL) PHARMACY CHOICE IS [facility] [place], [place]. MBC

## 2016-12-12 NOTE — Telephone Encounter (Signed)
I have not seen this patient since 02/2016.  [place] to [place] [facility] [place] [facility], she was advised that any additional refills of her medications would require a visit with me.  She has refills through 01/2017 on file for all the non-controlled medications.  I have not received any communication with the patient about needing help finding a new provider in her area.    [facility] [address] [place], [place] [address]  [phone]  Freda MunroMary Louise Parker Church, MD [address] [place], [place] [address]  [phone]  Pradip C. Tedra Senegalhakkar, M.D. [address] [place], [place] [address]  [phone]

## 2016-12-15 NOTE — Telephone Encounter (Signed)
Pt calling for a refill on Omeprazole she states that Chelle was suppose to help her find a Doctor where shes at its so hard for her to find a Doctor that takes on Transgender patients she said that her stomach has been upset for 1 week and would like for you to send [facility] to [facility]  [place] she says that she suppose to take it each day and if she don't that shell end [facility]

## 2016-12-17 ENCOUNTER — Other Ambulatory Visit: Payer: Self-pay | Admitting: Emergency Medicine

## 2016-12-17 DIAGNOSIS — K219 Gastro-esophageal reflux disease without esophagitis: Secondary | ICD-10-CM

## 2016-12-17 MED ORDER — OMEPRAZOLE 40 MG PO CPDR: 40.0000 mg | DELAYED_RELEASE_CAPSULE | Freq: Every day | ORAL | 0 refills | Status: DC

## 2016-12-17 NOTE — Telephone Encounter (Signed)
Pt states, he does not have a PCP and was turned down by three different practiced due to transgender sexuality.  States, he is unable to come in until next month due to lack of transportation and money.  Advised, I will give him #30 day refill Omeprazole after he schedules office visit.  Transferred to schedulers. Medication e-scribed to pharmacy

## 2016-12-19 ENCOUNTER — Telehealth: Payer: Self-pay | Admitting: Physician Assistant

## 2016-12-19 DIAGNOSIS — K219 Gastro-esophageal reflux disease without esophagitis: Secondary | ICD-10-CM

## 2016-12-19 MED ORDER — OMEPRAZOLE 40 MG PO CPDR: 40.0000 mg | DELAYED_RELEASE_CAPSULE | Freq: Every day | ORAL | 0 refills | Status: DC

## 2016-12-19 NOTE — Telephone Encounter (Signed)
Pt called wondering about her acid reflex medication that she says was supposed to be called in 48 hours ago. I explained to her Chelle might have just gotten to it today, she says she is sick & cant hold nothing down.   Please Advise

## 2016-12-24 ENCOUNTER — Telehealth: Payer: Self-pay | Admitting: Physician Assistant

## 2017-01-06 ENCOUNTER — Ambulatory Visit (INDEPENDENT_AMBULATORY_CARE_PROVIDER_SITE_OTHER): Payer: PRIVATE HEALTH INSURANCE | Admitting: Physician Assistant

## 2017-01-06 ENCOUNTER — Encounter: Payer: Self-pay | Admitting: Physician Assistant

## 2017-01-06 DIAGNOSIS — K219 Gastro-esophageal reflux disease without esophagitis: Secondary | ICD-10-CM

## 2017-01-06 DIAGNOSIS — F32A Depression, unspecified: Secondary | ICD-10-CM

## 2017-01-06 DIAGNOSIS — F411 Generalized anxiety disorder: Secondary | ICD-10-CM

## 2017-01-06 DIAGNOSIS — R110 Nausea: Secondary | ICD-10-CM | POA: Diagnosis not present

## 2017-01-06 DIAGNOSIS — N644 Mastodynia: Secondary | ICD-10-CM

## 2017-01-06 DIAGNOSIS — Z79890 Hormone replacement therapy (postmenopausal): Secondary | ICD-10-CM | POA: Diagnosis not present

## 2017-01-06 DIAGNOSIS — I10 Essential (primary) hypertension: Secondary | ICD-10-CM | POA: Diagnosis not present

## 2017-01-06 DIAGNOSIS — F329 Major depressive disorder, single episode, unspecified: Secondary | ICD-10-CM | POA: Diagnosis not present

## 2017-01-06 DIAGNOSIS — F640 Transsexualism: Secondary | ICD-10-CM

## 2017-01-06 MED ORDER — ALPRAZOLAM 1 MG PO TABS: 1.0000 mg | ORAL_TABLET | Freq: Three times a day (TID) | ORAL | 0 refills | Status: DC

## 2017-01-06 MED ORDER — ALPRAZOLAM 1 MG PO TABS: 1.0000 mg | ORAL_TABLET | Freq: Three times a day (TID) | ORAL | 0 refills | Status: DC | PRN

## 2017-01-06 MED ORDER — FLUOXETINE HCL 20 MG PO TABS: 40.0000 mg | ORAL_TABLET | Freq: Every day | ORAL | 1 refills | Status: DC

## 2017-01-06 MED ORDER — SPIRONOLACTONE 100 MG PO TABS: 200.0000 mg | ORAL_TABLET | Freq: Once | ORAL | 1 refills | Status: DC

## 2017-01-06 MED ORDER — OMEPRAZOLE 20 MG PO CPDR: 20.0000 mg | DELAYED_RELEASE_CAPSULE | Freq: Two times a day (BID) | ORAL | 1 refills | Status: DC

## 2017-01-06 MED ORDER — LISINOPRIL 10 MG PO TABS: 10.0000 mg | ORAL_TABLET | Freq: Every day | ORAL | 1 refills | Status: DC

## 2017-01-06 MED ORDER — ONDANSETRON 4 MG PO TBDP: 4.0000 mg | ORAL_TABLET | Freq: Three times a day (TID) | ORAL | 0 refills | Status: DC | PRN

## 2017-01-06 MED ORDER — ESTRADIOL CYPIONATE 5 MG/ML IM OIL: TOPICAL_OIL | INTRAMUSCULAR | 1 refills | Status: DC

## 2017-01-06 MED ORDER — "SYRINGE 22G X 1-1/2"" 3 ML MISC": 0 refills | Status: AC

## 2017-01-06 NOTE — Patient Instructions (Addendum)
Call Dr. Mariella SaaPittaway's office and ask them to recommend a female trans-friendly primary care provider.  Come back, fasting, when it's time to do the pre-operative exam/clearance, and we will update all the labs then.    IF you received an x-ray today, you will receive an invoice from [facility]. Please contact [facility] at [phone] with questions or concerns regarding your invoice.   IF you received labwork today, you will receive an invoice from [facility]. Please contact [facility] at [phone] with questions or concerns regarding your invoice.   Our billing staff will not be able to assist you with questions regarding bills from these companies.  You will be contacted with the lab results as soon as they are available. The fastest way to get your results is to activate your My Chart account. Instructions are located on the last page of this paperwork. If you have not heard from us regarding the results in 2 weeks, please contact this office.     Did you know that you begin to benefit from quitting smoking within the first twenty minutes? It's TRUE.  At 20 minutes: -blood pressure decreases -pulse rate drops -body temperature of hands and feet increases  At 8 hours: -carbon monoxide level in blood drops to normal -oxygen level in blood increases to normal  At 24 hours: -the chance of heart attack decreases  At 48 hours: -nerve endings start regrowing -ability to smell and taste is enhanced  2 weeks-3 months: -circulation improves -walking becomes easier -lung function improves  1-9 months: -coughing, sinus congestion, fatigue and shortness of breath decreases  1 year: -excess risk of heart disease is decreased to HALF that of a smoker  5 years: Stroke risk is reduced to that of people who have never smoked  10 years: -risk of lung cancer drops to as little as half that of continuing smokers -risk of cancer of the mouth, throat, esophagus,  bladder, kidney and pancreas decreases -risk of ulcer decreases  15 years -risk of heart disease is now similar to that of people who have never smoked -risk of death returns to nearly the level of people who have never smoked

## 2017-01-06 NOTE — Progress Notes (Deleted)
   Subjective:    Patient ID: Anita Sparks, female    DOB: 08-03-1966, 51 y.o.   MRN: [id number]  HPI    Review of Systems  Constitutional: Negative for fever.  HENT: Negative for congestion, postnasal drip, rhinorrhea and sinus pressure.   Respiratory: Positive for cough, chest tightness, shortness of breath and wheezing.   Cardiovascular: Negative for chest pain.  Gastrointestinal: Positive for nausea (associated with use of the inhaler).  Endocrine: Negative.   Genitourinary: Negative.   Musculoskeletal: Negative.   Skin: Negative.   Allergic/Immunologic: Positive for environmental allergies.  Neurological: Positive for light-headedness. Negative for headaches.  Hematological: Positive for adenopathy.  Psychiatric/Behavioral: Positive for sleep disturbance.       Objective:   Physical Exam        Assessment & Plan:

## 2017-01-06 NOTE — Progress Notes (Signed)
Patient ID: Anita Sparks, female    DOB: 1966/02/13, 51 y.o.   MRN: [id number]  PCP: Porfirio Oar, PA-C  Chief Complaint  Patient presents with  . Medication Refill    all medications    Subjective:   Presents for medication refills. She is accompanied by her wife, Anita Sparks.  I last saw [facility] 02/12/2016. Since then, she has moved to [place] and had the LEFT breast implant removed. I provided a 66-month supply of medications in order to allow time for her to establish with a local provider.  Has not been able to find a new provider in the [place] area. I provided several names found on the [facility] website. She relates that no one will see her because she is transgender. She relates that even at [facility] the transphobia is preventing her from finding a new PCP in that area.  The LEFT breast implant was causing her a lot of pain. She was originally going to have it replaced 01/2016 with the original plastic surgeon in [place], [place], but at the time (she was also having female facial surgery), there was concern for infection of the capsule and so only the facial procedure was performed.  She was placed on antibiotics, and then had a rash, thought to be an allergic reaction to the antibiotic. She was seen in the ED at [facility] in November with worsening redness and pain, with purulent drainage. The LEFT breast implant was removed and the area was debrided. It turned out that the implant had come out of the capsule and the muscle anterior to it. The pain is resolved, but she remains very distressed.  The pain and disfigurement on the one side was so distressing to her that she was expressing suicidal ideations. This distress is slightly less since having the implant removed.  Needs the ID/serial numbers from the implants. Had the records, but has lost them in her move. Needs me to call [phone] (the surgical center in [place]) and state that she is a private patient of Dr.  Jasmine December and needs the records of her surgery there, including the numbers from the implants.  Is working on saving money to have the breast redo. Has increased smoking again, due to increased anxiety, and the previous tremor is back, and she has increased irritability and sensitivity. Suffering psychologically without the breast. Increased GERD symptoms.  Continues to refuse colonoscopy and Cologuard.  Also need records from the previous psychiatric practice:  [facility], [place], [place] We requested these previously but have not received them.   Depression screen [facility] 2/9 01/06/2017 01/23/2016 11/28/2015 05/01/2015 03/07/2015  Decreased Interest 2 0 3 3 2   Down, Depressed, Hopeless 2 0 3 3 2   PHQ - 2 Score 4 0 6 6 4   Altered sleeping 3 - 3 3 3   Tired, decreased energy 3 - 3 3 3   Change in appetite 3 - 0 0 0  Feeling bad or failure about yourself  3 - 1 3 3   Trouble concentrating 3 - 3 3 2   Moving slowly or fidgety/restless 3 - 3 0 0  Suicidal thoughts 0 - 0 0 2  PHQ-9 Score 22 - 19 18 17   Difficult doing work/chores Extremely dIfficult - Extremely dIfficult Somewhat difficult Not difficult at all     Review of Systems As above. No CP, SOB, HA, dizziness.    Patient Active Problem List   Diagnosis Date Noted  . Hepatitis C antibody test  positive 03/08/2015  . 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 Gender identity disorder 03/08/2015  . Gender dysphoria 03/08/2015  . Hormone replacement therapy (HRT) 03/07/2015  . Leukocytosis 01/30/2015  . Anoxic brain injury (HCC) 01/12/2015  . 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 Benign essential HTN 01/12/2015  . GERD (gastroesophageal reflux disease) 01/12/2015  . Tinnitus 01/12/2015  . Edentulous 01/12/2015  . BMI 30.0-30.9,adult 01/12/2015  . Smoker 01/12/2015  . Bipolar 1 disorder (HCC)      Prior to Admission medications   Medication Sig Start Date End Date Taking? Authorizing Provider  ALPRAZolam Prudy Feeler(XANAX) 1 MG tablet  Take 1 tablet (1 mg total) by mouth 3 (three) times daily as needed for anxiety. 09/05/16  Yes Slayter Moorhouse, PA-C  aspirin 81 MG tablet Take 81 mg by mouth daily.   Yes [provider]  Cholecalciferol (VITAMIN D PO) Take 1 tablet by mouth daily.   Yes [provider]  estradiol cypionate (DEPO-ESTRADIOL) 5 MG/ML injection 2.14 mL IM Q 13 days Patient taking differently: 2.24 mL IM Q 13 days 09/11/16  Yes Aubreana Cornacchia, PA-C  FLUoxetine (PROZAC) 20 MG tablet Take 2 tablets (40 mg total) by mouth daily. 09/11/16  Yes Lesslie Mckeehan, PA-C  ibuprofen (ADVIL,MOTRIN) 200 MG tablet Take 800 mg by mouth every 6 (six) hours as needed for moderate pain.   Yes [provider]  lisinopril (PRINIVIL,ZESTRIL) 10 MG tablet Take 1 tablet (10 mg total) by mouth daily. 09/11/16  Yes Nevaeh Korte, PA-C  Multiple Vitamin (MULTIVITAMIN WITH MINERALS) TABS tablet Take 1 tablet by mouth daily.   Yes [provider]  omeprazole (PRILOSEC) 40 MG capsule Take 1 capsule (40 mg total) by mouth daily. 12/19/16  Yes Abiel Antrim, PA-C  ondansetron (ZOFRAN-ODT) 4 MG disintegrating tablet Take 1-2 tablets (4-8 mg total) by mouth every 8 (eight) hours as needed for nausea or vomiting. 09/11/16  Yes Shekelia Boutin, PA-C  Syringe/Needle, Disp, (SYRINGE 3CC/22GX1-1/2") 22G X 1-1/2" 3 ML MISC Use as directed 01/23/16  Yes Lisa-Marie Rueger, PA-C  ALPRAZolam (XANAX) 1 MG tablet Take 1 tablet (1 mg total) by mouth 3 (three) times daily. Patient not taking: Reported on 01/06/2017 09/05/16   Porfirio OarJeffery, Okema Rollinson, PA-C  ALPRAZolam Prudy Feeler(XANAX) 1 MG tablet Take 1 tablet (1 mg total) by mouth 3 (three) times daily as needed for anxiety. for anxiety Patient not taking: Reported on 01/06/2017 09/05/16   Porfirio OarJeffery, Aleyssa Pike, PA-C  spironolactone (ALDACTONE) 100 MG tablet Take 2 tablets (200 mg total) by mouth once. 09/11/16 09/11/16  Porfirio OarJeffery, Wolfe Camarena, PA-C     Allergies  Allergen Reactions  . Lactose Intolerance (Gi)   .  Wellbutrin [Bupropion] Palpitations       Objective:  Physical Exam  Constitutional: She is oriented to person, place, and time. She appears well-developed and well-nourished. She is active and cooperative. No distress.  BP 119/83 (BP Location: Right Arm, Patient Position: Sitting, Cuff Size: Normal)   Pulse 91   Temp 97.1 F (36.2 C) (Oral)   Resp 16   Ht 5\' 9"  (1.753 m)   Wt 210 lb 12.8 oz (95.6 kg)   SpO2 97%   BMI 31.13 kg/m   HENT:  Head: Normocephalic and atraumatic.  Right Ear: Hearing normal.  Left Ear: Hearing normal.  Eyes: Conjunctivae are normal. No scleral icterus.  Neck: Normal range of motion. Neck supple. No thyromegaly present.  Cardiovascular: Normal rate, regular rhythm and normal heart sounds.   Pulses:      Radial pulses are  2+ on the right side, and 2+ on the left side.  Pulmonary/Chest: Effort normal and breath sounds normal.  Lymphadenopathy:       Head (right side): No tonsillar, no preauricular, no posterior auricular and no occipital adenopathy present.       Head (left side): No tonsillar, no preauricular, no posterior auricular and no occipital adenopathy present.    She has no cervical adenopathy.       Right: No supraclavicular adenopathy present.       Left: No supraclavicular adenopathy present.  Neurological: She is alert and oriented to person, place, and time. No sensory deficit.  Skin: Skin is warm, dry and intact. No rash noted. No cyanosis or erythema. Nails show no clubbing.  Psychiatric: She has a normal mood and affect. Her speech is normal and behavior is normal.      Assessment & Plan:   Problem List Items Addressed This Visit    Benign essential HTN (Chronic)    Controlled. Continue current treatment. Needs BMET. Deferred today, as she anticipates surgery this summer and will need it as part of her pre-operative clearance.      Relevant Medications   lisinopril (PRINIVIL,ZESTRIL) 10 MG tablet   GERD (gastroesophageal reflux  disease) (Chronic)    Increased symptoms with recent increased stress. Prefers to take 20 mg BID, rather than 40 mg QD.      Relevant Medications   omeprazole (PRILOSEC) 20 MG capsule   ondansetron (ZOFRAN-ODT) 4 MG disintegrating tablet   Transsexualism    Provided contact information for Dr. Ruby Cola at [facility], who provides transgender care as an endocrinologist. The patient refuses to see a female provider, she she is encouraged to ask for recommendations for trans-friendly primary care providers in the area.      Hormone replacement therapy (HRT)    Female-to-female transgender. Continue HRT. Update labs when she returns for pre-operative clearance visit in a couple of months for replacement of the LEFT breast implant.      Relevant Medications   estradiol cypionate (DEPO-ESTRADIOL) 5 MG/ML injection   Syringe/Needle, Disp, (SYRINGE 3CC/22GX1-1/2") 22G X 1-1/2" 3 ML MISC   Depression    Not currently suicidal. Anticipate improvement once the LEFT breast implant is replaced, planned for this summer.      Relevant Medications   FLUoxetine (PROZAC) 20 MG tablet   ALPRAZolam (XANAX) 1 MG tablet   ALPRAZolam (XANAX) 1 MG tablet   ALPRAZolam (XANAX) 1 MG tablet   Generalized anxiety disorder    Exacerbated by stress related to loss of LEFT breast implant. Anticipate improvement with upcoming surgery.      Relevant Medications   ALPRAZolam (XANAX) 1 MG tablet   ALPRAZolam (XANAX) 1 MG tablet   ALPRAZolam (XANAX) 1 MG tablet   RESOLVED: Breast pain    Other Visit Diagnoses    Nausea without vomiting       Relevant Medications   ondansetron (ZOFRAN-ODT) 4 MG disintegrating tablet       Return for pre-operative clearance and fasting labs.   Fernande Bras, PA-C Primary Care at [facility]

## 2017-01-07 DIAGNOSIS — F32A Depression, unspecified: Secondary | ICD-10-CM | POA: Insufficient documentation

## 2017-01-07 DIAGNOSIS — F329 Major depressive disorder, single episode, unspecified: Secondary | ICD-10-CM | POA: Insufficient documentation

## 2017-01-07 DIAGNOSIS — F411 Generalized anxiety disorder: Secondary | ICD-10-CM | POA: Insufficient documentation

## 2017-01-07 NOTE — Assessment & Plan Note (Signed)
Exacerbated by stress related to loss of LEFT breast implant. Anticipate improvement with upcoming surgery.

## 2017-01-07 NOTE — Assessment & Plan Note (Signed)
Increased symptoms with recent increased stress. Prefers to take 20 mg BID, rather than 40 mg QD.

## 2017-01-07 NOTE — Assessment & Plan Note (Signed)
Female-to-female transgender. Continue HRT. Update labs when she returns for pre-operative clearance visit in a couple of months for replacement of the LEFT breast implant.

## 2017-01-07 NOTE — Assessment & Plan Note (Signed)
Provided contact information for Dr. Ruby ColaPittaway at [facility], who provides transgender care as an endocrinologist. The patient refuses to see a female provider, she she is encouraged to ask for recommendations for trans-friendly primary care providers in the area.

## 2017-01-07 NOTE — Assessment & Plan Note (Signed)
Controlled. Continue current treatment. Needs BMET. Deferred today, as she anticipates surgery this summer and will need it as part of her pre-operative clearance.

## 2017-01-07 NOTE — Assessment & Plan Note (Signed)
Not currently suicidal. Anticipate improvement once the LEFT breast implant is replaced, planned for this summer.

## 2017-01-12 ENCOUNTER — Telehealth: Payer: Self-pay | Admitting: Family Medicine

## 2017-01-12 NOTE — Telephone Encounter (Signed)
chelle pt would like for you to call her insurance she states that the insurance need to know that pt gets estradiol injections

## 2017-01-13 ENCOUNTER — Telehealth: Payer: Self-pay

## 2017-01-13 NOTE — Telephone Encounter (Signed)
Insurance called. Initiated PA by phone. Awaiting response

## 2017-01-13 NOTE — Telephone Encounter (Signed)
The insurance is aware that the patient is currently using estrogen in an injectable form. They have notified me, and likely her, that they will no longer cover the injectable form of the product.  Her options are to pay OOP for the injectable product or change to a covered formulation (most likely pills). If she wants to dispute this, she should contact the insurance pharmacy benefit plan and as if it is covered for certain conditions or in certain situations. If there is a form I need to complete, I am happy to do so.

## 2017-01-13 NOTE — Telephone Encounter (Signed)
Request for PA form faxed to insurance company. Awaiting repsonse/fax

## 2017-01-14 ENCOUNTER — Telehealth: Payer: Self-pay | Admitting: Physician Assistant

## 2017-01-14 NOTE — Telephone Encounter (Signed)
Pt called back to let us know that Chelle can disregard the previous message that pt called the insurance company and they have changed there minds and is going to approve the injectable   Best number [phone]

## 2017-01-14 NOTE — Telephone Encounter (Signed)
I spoke to the pt and she wouldn't let me finish telling her your recommendations, I had to interrupt her several times just to do so. She stated she will have her wife call the insurance because she needs a 90 day supply of the injectable, she sounded a little upset and stated you can just give her the pills @ 8mg  tablets once daily, 90 day supply with one refill and if anything happens to her then her wife can sue the insurance company.

## 2017-01-14 NOTE — Telephone Encounter (Signed)
I just received a letter that it's been approved!!!

## 2017-01-15 NOTE — Telephone Encounter (Signed)
Per telephone message on 01/14/17 Injectable will be Approved

## 2017-03-31 ENCOUNTER — Telehealth: Payer: Self-pay | Admitting: Physician Assistant

## 2017-03-31 NOTE — Telephone Encounter (Signed)
Pt is requesting a med refill on her xanax. Per note in chart and I also verify with Chelle (on 03/31/17) that she WILL NOT be able to refill the pt xanax until she comes in for an appt.

## 2017-06-11 ENCOUNTER — Encounter: Payer: Self-pay | Admitting: Physician Assistant

## 2017-06-11 ENCOUNTER — Ambulatory Visit (INDEPENDENT_AMBULATORY_CARE_PROVIDER_SITE_OTHER): Payer: PRIVATE HEALTH INSURANCE

## 2017-06-11 ENCOUNTER — Ambulatory Visit: Payer: PRIVATE HEALTH INSURANCE | Admitting: Physician Assistant

## 2017-06-11 ENCOUNTER — Other Ambulatory Visit: Payer: Self-pay

## 2017-06-11 VITALS — BP 124/84 | HR 77 | Temp 97.8°F | Resp 18 | Wt 203.0 lb

## 2017-06-11 DIAGNOSIS — G8929 Other chronic pain: Secondary | ICD-10-CM | POA: Diagnosis not present

## 2017-06-11 DIAGNOSIS — M898X1 Other specified disorders of bone, shoulder: Secondary | ICD-10-CM

## 2017-06-11 DIAGNOSIS — M542 Cervicalgia: Secondary | ICD-10-CM

## 2017-06-11 DIAGNOSIS — F411 Generalized anxiety disorder: Secondary | ICD-10-CM | POA: Diagnosis not present

## 2017-06-11 DIAGNOSIS — R110 Nausea: Secondary | ICD-10-CM

## 2017-06-11 DIAGNOSIS — K219 Gastro-esophageal reflux disease without esophagitis: Secondary | ICD-10-CM | POA: Diagnosis not present

## 2017-06-11 DIAGNOSIS — M545 Low back pain: Secondary | ICD-10-CM

## 2017-06-11 DIAGNOSIS — I10 Essential (primary) hypertension: Secondary | ICD-10-CM

## 2017-06-11 DIAGNOSIS — F329 Major depressive disorder, single episode, unspecified: Secondary | ICD-10-CM

## 2017-06-11 DIAGNOSIS — Z79890 Hormone replacement therapy (postmenopausal): Secondary | ICD-10-CM | POA: Diagnosis not present

## 2017-06-11 DIAGNOSIS — F32A Depression, unspecified: Secondary | ICD-10-CM

## 2017-06-11 MED ORDER — MELOXICAM 15 MG PO TABS: 15.0000 mg | ORAL_TABLET | Freq: Every day | ORAL | 1 refills | Status: AC

## 2017-06-11 MED ORDER — ALPRAZOLAM 1 MG PO TABS: 1.0000 mg | ORAL_TABLET | Freq: Three times a day (TID) | ORAL | 0 refills | Status: AC | PRN

## 2017-06-11 MED ORDER — ALPRAZOLAM 1 MG PO TABS: 1.0000 mg | ORAL_TABLET | Freq: Three times a day (TID) | ORAL | 0 refills | Status: AC

## 2017-06-11 MED ORDER — FLUOXETINE HCL 20 MG PO TABS: 20.0000 mg | ORAL_TABLET | Freq: Three times a day (TID) | ORAL | 3 refills | Status: AC

## 2017-06-11 MED ORDER — HYDROCODONE-ACETAMINOPHEN 5-325 MG PO TABS: 1.0000 | ORAL_TABLET | Freq: Every evening | ORAL | 0 refills | Status: DC | PRN

## 2017-06-11 MED ORDER — SPIRONOLACTONE 100 MG PO TABS: 200.0000 mg | ORAL_TABLET | Freq: Once | ORAL | 1 refills | Status: AC

## 2017-06-11 MED ORDER — CYCLOBENZAPRINE HCL 10 MG PO TABS: 10.0000 mg | ORAL_TABLET | Freq: Three times a day (TID) | ORAL | 0 refills | Status: DC | PRN

## 2017-06-11 MED ORDER — OMEPRAZOLE 20 MG PO CPDR: 20.0000 mg | DELAYED_RELEASE_CAPSULE | Freq: Two times a day (BID) | ORAL | 1 refills | Status: AC

## 2017-06-11 MED ORDER — LISINOPRIL 10 MG PO TABS: 10.0000 mg | ORAL_TABLET | Freq: Every day | ORAL | 1 refills | Status: AC

## 2017-06-11 MED ORDER — ESTRADIOL CYPIONATE 5 MG/ML IM OIL: TOPICAL_OIL | INTRAMUSCULAR | 1 refills | Status: AC

## 2017-06-11 MED ORDER — ONDANSETRON 4 MG PO TBDP: 4.0000 mg | ORAL_TABLET | Freq: Three times a day (TID) | ORAL | 0 refills | Status: AC | PRN

## 2017-06-11 NOTE — Patient Instructions (Signed)
     IF you received an x-ray today, you will receive an invoice from [facility]. Please contact [facility] at [phone] with questions or concerns regarding your invoice.   IF you received labwork today, you will receive an invoice from [facility]. Please contact [facility] at [phone] with questions or concerns regarding your invoice.   Our billing staff will not be able to assist you with questions regarding bills from these companies.  You will be contacted with the lab results as soon as they are available. The fastest way to get your results is to activate your My Chart account. Instructions are located on the last page of this paperwork. If you have not heard from us regarding the results in 2 weeks, please contact this office.     

## 2017-06-11 NOTE — Progress Notes (Signed)
Patient ID: Anita Sparks, adult    DOB: Jan 13, 1966, 51 y.o.   MRN: [id number]  PCP: Porfirio OarJeffery, Kemiah Booz, PA-C  Chief Complaint  Patient presents with  . Back Pain    X 1 year off and on - lower back and neck pain    Subjective:   Presents for evaluation of lower back and neck pain.  She is accompanied by her wife, Jeris PentaSaskia. [place] are now living in [place], but have not found new providers there.  Symptoms have been present for at least a year, but seem to be getting worse x 1 month. Thinks it's related to the broken collar bone on the LEFT, sustained 10/06/2011. Has had pain there ever since. Was to have surgery, but "they don't fix them any more." No triggering event recalled associated with this worsening. Unable to lie on the LEFT side. When sitting on the couch, has to use pillows to prop herself up on the LEFT side. No LEFT arm pain. Taking ibuprofen 800 mg BID. Becoming less effective. Unable to sleep at night due to the neck and back pain.  Notes tinnitus since 2011, getting worse. Worse when she smokes. Worried that this represents cancer. "Makes me crazy."  Pain under the LEFT breast. Wants to make sure there's no lumps.   Review of Systems As above    Patient Active Problem List   Diagnosis Date Noted  . Depression 01/07/2017  . Generalized anxiety disorder 01/07/2017  . Hepatitis C antibody test positive 03/08/2015  . 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 Hormone replacement therapy (HRT) 03/07/2015  . Leukocytosis 01/30/2015  . Anoxic brain injury (HCC) 01/12/2015  . 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 Benign essential HTN 01/12/2015  . GERD (gastroesophageal reflux disease) 01/12/2015  . Tinnitus 01/12/2015  . Edentulous 01/12/2015  . BMI 30.0-30.9,adult 01/12/2015  . Smoker 01/12/2015  . Bipolar 1 disorder (HCC)      Prior to Admission medications   Medication Sig Start Date End Date Taking? Authorizing Provider  ALPRAZolam Prudy Feeler(XANAX) 1 MG tablet Take 1  tablet (1 mg total) by mouth 3 (three) times daily as needed for anxiety. for anxiety 01/06/17  Yes Peggye Poon, PA-C  ALPRAZolam (XANAX) 1 MG tablet Take 1 tablet (1 mg total) by mouth 3 (three) times daily as needed for anxiety. 01/06/17  Yes Xaine Sansom, PA-C  ALPRAZolam (XANAX) 1 MG tablet Take 1 tablet (1 mg total) by mouth 3 (three) times daily. 01/06/17  Yes Lamoine Fredricksen, PA-C  aspirin 81 MG tablet Take 81 mg by mouth daily.   Yes [provider]  Cholecalciferol (VITAMIN D PO) Take 1 tablet by mouth daily.   Yes [provider]  estradiol cypionate (DEPO-ESTRADIOL) 5 MG/ML injection 2.24 mL IM Q 13 days 01/06/17  Yes Pate Aylward, PA-C  FLUoxetine (PROZAC) 20 MG tablet Take 2 tablets (40 mg total) by mouth daily. 01/06/17  Yes Lilah Mijangos, PA-C  ibuprofen (ADVIL,MOTRIN) 200 MG tablet Take 800 mg by mouth every 6 (six) hours as needed for moderate pain.   Yes [provider]  lisinopril (PRINIVIL,ZESTRIL) 10 MG tablet Take 1 tablet (10 mg total) by mouth daily. 01/06/17  Yes Nayden Czajka, PA-C  Multiple Vitamin (MULTIVITAMIN WITH MINERALS) TABS tablet Take 1 tablet by mouth daily.   Yes [provider]  omeprazole (PRILOSEC) 20 MG capsule Take 1 capsule (20 mg total) by mouth 2 (two) times daily. 01/06/17  Yes Candon Caras, PA-C  ondansetron (ZOFRAN-ODT) 4 MG disintegrating tablet  Take 1-2 tablets (4-8 mg total) by mouth every 8 (eight) hours as needed for nausea or vomiting. 01/06/17  Yes Jovana Rembold, PA-C  Syringe/Needle, Disp, (SYRINGE 3CC/22GX1-1/2") 22G X 1-1/2" 3 ML MISC Use as directed 01/06/17  Yes Dayonna Selbe, PA-C  spironolactone (ALDACTONE) 100 MG tablet Take 2 tablets (200 mg total) by mouth once. 01/06/17 01/06/17  Porfirio OarJeffery, Lasha Echeverria, PA-C     Allergies  Allergen Reactions  . Lactose Intolerance (Gi)   . Wellbutrin [Bupropion] Palpitations       Objective:  Physical Exam  Constitutional: She is oriented to person, place, and  time. She appears well-developed and well-nourished. She is active and cooperative. No distress.  BP 124/84 (BP Location: Left Arm, Patient Position: Sitting, Cuff Size: Normal)   Pulse 77   Temp 97.8 F (36.6 C) (Oral)   Resp 18   Wt 203 lb (92.1 kg)   SpO2 99%   BMI 29.98 kg/m  Transgender female  HENT:  Head: Normocephalic and atraumatic.  Right Ear: Hearing normal.  Left Ear: Hearing normal.  Eyes: Conjunctivae are normal. No scleral icterus.  Neck: Normal range of motion. Neck supple. No thyromegaly present.  Cardiovascular: Normal rate, regular rhythm and normal heart sounds.  Pulses:      Radial pulses are 2+ on the right side, and 2+ on the left side.  Pulmonary/Chest: Effort normal and breath sounds normal. Left breast exhibits tenderness. Left breast exhibits no mass and no skin change.  Musculoskeletal:       Cervical back: She exhibits tenderness, bony tenderness and pain. She exhibits normal range of motion, no swelling, no edema, no deformity, no laceration, no spasm and normal pulse.       Thoracic back: Normal.       Lumbar back: She exhibits tenderness, bony tenderness and pain. She exhibits normal range of motion, no swelling, no edema, no deformity, no laceration, no spasm and normal pulse.       Arms: Lymphadenopathy:       Head (right side): No tonsillar, no preauricular, no posterior auricular and no occipital adenopathy present.       Head (left side): No tonsillar, no preauricular, no posterior auricular and no occipital adenopathy present.    She has no cervical adenopathy.       Right: No supraclavicular adenopathy present.       Left: No supraclavicular adenopathy present.  Neurological: She is alert and oriented to person, place, and time. No sensory deficit.  Skin: Skin is warm, dry and intact. No rash noted. No cyanosis or erythema. Nails show no clubbing.  Psychiatric: She has a normal mood and affect. Her speech is normal and behavior is normal.              Assessment & Plan:   Problem List Items Addressed This Visit    Benign essential HTN (Chronic)    COntrolled. Continue lisinopril and spironolactone.      Relevant Medications   lisinopril (PRINIVIL,ZESTRIL) 10 MG tablet   GERD (gastroesophageal reflux disease) (Chronic)    Controlled. Continue omeprazole.      Relevant Medications   ondansetron (ZOFRAN-ODT) 4 MG disintegrating tablet   omeprazole (PRILOSEC) 20 MG capsule   Hormone replacement therapy (HRT)    Pleased with progressive improvement of facial features. Tolerating estradiol injections and spironolactone.      Relevant Medications   estradiol cypionate (DEPO-ESTRADIOL) 5 MG/ML injection   Depression    Stable. Continue fluoxetine and alprazolam. Would increase  fluoxetine in future in attempt to reduce need for alprazolam.      Relevant Medications   FLUoxetine (PROZAC) 20 MG tablet   ALPRAZolam (XANAX) 1 MG tablet   ALPRAZolam (XANAX) 1 MG tablet   ALPRAZolam (XANAX) 1 MG tablet   ALPRAZolam (XANAX) 1 MG tablet   Generalized anxiety disorder    Stable. Continue fluoxetine and alprazolam. In future would increase fluoxetine in hopes of reducing alprazolam.      Relevant Medications   FLUoxetine (PROZAC) 20 MG tablet   ALPRAZolam (XANAX) 1 MG tablet   ALPRAZolam (XANAX) 1 MG tablet   ALPRAZolam (XANAX) 1 MG tablet   ALPRAZolam (XANAX) 1 MG tablet    Other Visit Diagnoses    Neck pain    -  Primary   await radiographs.    Relevant Medications   cyclobenzaprine (FLEXERIL) 10 MG tablet   meloxicam (MOBIC) 15 MG tablet   HYDROcodone-acetaminophen (NORCO) 5-325 MG tablet   Other Relevant Orders   DG Cervical Spine Complete (Completed)   Chronic midline low back pain without sciatica       Await radiographs   Relevant Medications   cyclobenzaprine (FLEXERIL) 10 MG tablet   meloxicam (MOBIC) 15 MG tablet   HYDROcodone-acetaminophen (NORCO) 5-325 MG tablet   Other Relevant Orders   DG  Lumbar Spine Complete (Completed)   Pain of left clavicle       previous fracture, persistent deformity with skin tenting but no breakdown. May need surgical intervention to address deformity, unclear if pain will resolve.   Relevant Medications   cyclobenzaprine (FLEXERIL) 10 MG tablet   meloxicam (MOBIC) 15 MG tablet   HYDROcodone-acetaminophen (NORCO) 5-325 MG tablet   Nausea without vomiting       Relevant Medications   ondansetron (ZOFRAN-ODT) 4 MG disintegrating tablet       Return in about 4 months (around 10/09/2017) for re-evaluation of chronic issues.   Fernande Bras, PA-C Primary Care at [facility]

## 2017-06-15 ENCOUNTER — Other Ambulatory Visit: Payer: Self-pay | Admitting: Physician Assistant

## 2017-06-15 DIAGNOSIS — M47816 Spondylosis without myelopathy or radiculopathy, lumbar region: Secondary | ICD-10-CM

## 2017-06-15 DIAGNOSIS — M4310 Spondylolisthesis, site unspecified: Secondary | ICD-10-CM

## 2017-06-15 DIAGNOSIS — M47812 Spondylosis without myelopathy or radiculopathy, cervical region: Secondary | ICD-10-CM

## 2017-06-15 NOTE — Telephone Encounter (Signed)
Copied from CRM [id number]. Topic: Quick Communication - See Telephone Encounter >> Jun 15, 2017  4:41 PM Anita Sparks, Anita Sparks, Anita Sparks wrote: CRM for notification. See Telephone encounter for: Patient calling and states [facility] [place]  did not receive any of his medications that were prescribed on 06/11/17. Also that the pain medication is not helping at all.   Patient would like to know if a new pain rx can be mailed to him? [place] over [place] from the office?  06/15/17.

## 2017-06-15 NOTE — Telephone Encounter (Signed)
Also would like to know xray results

## 2017-06-16 NOTE — Telephone Encounter (Signed)
Copied from CRM [id number]. Topic: Quick Communication - See Telephone Encounter >> Jun 16, 2017  9:41 AM Viviann SpareWhite, Selina wrote: CRM for notification. See Telephone encounter for: 06/16/17. Patient would like ALL of her prescriptions sent to [facility] [id number] [place], [place] - [address]

## 2017-06-16 NOTE — Telephone Encounter (Signed)
Informed patient of results of xray.  Confirmed with pharmacy prescription are there and informed patient to call and they will fill them.   Anita Sparks patient states he would like a referral to orthopedics in [place] area. Patient states that the pain medication is not touching his pain and would like a stronger dose or something else. Please Advise

## 2017-06-17 DIAGNOSIS — M4310 Spondylolisthesis, site unspecified: Secondary | ICD-10-CM | POA: Insufficient documentation

## 2017-06-17 DIAGNOSIS — M47812 Spondylosis without myelopathy or radiculopathy, cervical region: Secondary | ICD-10-CM | POA: Insufficient documentation

## 2017-06-17 DIAGNOSIS — M47816 Spondylosis without myelopathy or radiculopathy, lumbar region: Secondary | ICD-10-CM | POA: Insufficient documentation

## 2017-06-17 NOTE — Telephone Encounter (Addendum)
Please note that this patient is transgender FEMALE and uses female pronouns.  She will need to see a specialist for more definitive treatment.  Orders Placed This Encounter  Procedures  . Ambulatory referral to Orthopedic Surgery    Referral Priority:   Routine    Referral Type:   Surgical    Referral Reason:   Specialty Services Required    Requested Specialty:   Orthopedic Surgery    Number of Visits Requested:   1

## 2017-06-21 NOTE — Assessment & Plan Note (Signed)
Controlled.  Continue omeprazole. 

## 2017-06-21 NOTE — Assessment & Plan Note (Signed)
Stable. Continue fluoxetine and alprazolam. Would increase fluoxetine in future in attempt to reduce need for alprazolam.

## 2017-06-21 NOTE — Assessment & Plan Note (Signed)
Stable. Continue fluoxetine and alprazolam. In future would increase fluoxetine in hopes of reducing alprazolam.

## 2017-06-21 NOTE — Assessment & Plan Note (Signed)
COntrolled. Continue lisinopril and spironolactone.

## 2017-06-21 NOTE — Assessment & Plan Note (Signed)
Pleased with progressive improvement of facial features. Tolerating estradiol injections and spironolactone.

## 2017-06-23 MED ORDER — HYDROCODONE-ACETAMINOPHEN 5-325 MG PO TABS: 1.0000 | ORAL_TABLET | Freq: Every evening | ORAL | 0 refills | Status: AC | PRN

## 2017-06-23 NOTE — Progress Notes (Unsigned)
Meds ordered this encounter  Medications  . HYDROcodone-acetaminophen (NORCO) 5-325 MG tablet    Sig: Take 1-2 tablets by mouth at bedtime as needed for severe pain.    Dispense:  30 tablet    Refill:  0    May fill on/after 06/26/2017    Order Specific Question:   Supervising Provider    Answer:   Clelia CroftSHAW, EVA N [[id number]]

## 2017-06-26 ENCOUNTER — Telehealth: Payer: Self-pay | Admitting: Physician Assistant

## 2017-06-26 NOTE — Telephone Encounter (Signed)
Pt called to request a letter from C. Tinnie GensJeffrey to clarify her condition and the reason that she needs to see an orthopedic. And also stated that the pain med that was prescribed for her is not helping her pain. She is asking for C. Tinnie GensJeffrey to give her a call on Monday to discuss her pain med.

## 2017-07-01 ENCOUNTER — Telehealth: Payer: Self-pay | Admitting: Physician Assistant

## 2017-07-01 NOTE — Telephone Encounter (Signed)
Copied from CRM [id number]. Topic: Quick Communication - See Telephone Encounter >> Jul 01, 2017  3:39 PM Eston Mouldavis, Sigrid Schwebach B wrote: CRM for notification. See Telephone encounter for:  PT is requesting a different pain medication, as the hydrocodone is not working for him,  he states he asked for a different med recently and when he went to pick it up it was the same med and he refused it 07/01/17. [facility]

## 2017-07-02 NOTE — Telephone Encounter (Signed)
Medication issue sent to Chelle.

## 2017-07-02 NOTE — Telephone Encounter (Signed)
1. Patient is transgender FEMALE. 2. As per my previous messages, the patient needs to either return here or see someone where she is if her pain is this severe.

## 2017-07-03 ENCOUNTER — Telehealth: Payer: Self-pay

## 2017-07-03 NOTE — Telephone Encounter (Signed)
Spoke with pt and pt has an appt to follow with Chelle on 07/08/2017 to discuss change of medications. Pt states that will be fine.

## 2017-07-08 ENCOUNTER — Telehealth: Payer: Self-pay | Admitting: Physician Assistant

## 2017-07-08 ENCOUNTER — Other Ambulatory Visit: Payer: Self-pay

## 2017-07-08 ENCOUNTER — Encounter: Payer: Self-pay | Admitting: Physician Assistant

## 2017-07-08 ENCOUNTER — Ambulatory Visit: Payer: PRIVATE HEALTH INSURANCE | Admitting: Physician Assistant

## 2017-07-08 DIAGNOSIS — M898X1 Other specified disorders of bone, shoulder: Secondary | ICD-10-CM

## 2017-07-08 DIAGNOSIS — G8929 Other chronic pain: Secondary | ICD-10-CM | POA: Diagnosis not present

## 2017-07-08 DIAGNOSIS — M545 Low back pain: Secondary | ICD-10-CM | POA: Diagnosis not present

## 2017-07-08 DIAGNOSIS — M542 Cervicalgia: Secondary | ICD-10-CM | POA: Diagnosis not present

## 2017-07-08 MED ORDER — OXYCODONE-ACETAMINOPHEN 7.5-325 MG PO TABS: 1.0000 | ORAL_TABLET | Freq: Four times a day (QID) | ORAL | 0 refills | Status: AC | PRN

## 2017-07-08 MED ORDER — CYCLOBENZAPRINE HCL 10 MG PO TABS: 10.0000 mg | ORAL_TABLET | Freq: Three times a day (TID) | ORAL | 0 refills | Status: AC | PRN

## 2017-07-08 NOTE — Telephone Encounter (Signed)
Left message for pt on 11/21 regarding referral to Ortho. Pt had requested we send her to an Ortho office that takes her insurance. I checked with her insurance and there were no offices in [place] that took [employer] insurance. There were offices in [place] and [place] that showed the closest locations. I let the pt know this on the vm and asked that she give us a call back to let us know how she would like to proceed or if there was an office she knew of that would take her insurance. I saw pt had an appt today so wanted to send this information in case there were any questions or the pt had an update of where she would like to go. Thanks!

## 2017-07-08 NOTE — Patient Instructions (Addendum)
Go ahead and find out which orthopedic specialists are in network for your NEW insurance. We can go ahead and get you scheduled for a date after you are covered by the new plan.    IF you received an x-ray today, you will receive an invoice from [facility]. Please contact [facility] at [phone] with questions or concerns regarding your invoice.   IF you received labwork today, you will receive an invoice from [facility]. Please contact [facility] at [phone] with questions or concerns regarding your invoice.   Our billing staff will not be able to assist you with questions regarding bills from these companies.  You will be contacted with the lab results as soon as they are available. The fastest way to get your results is to activate your My Chart account. Instructions are located on the last page of this paperwork. If you have not heard from us regarding the results in 2 weeks, please contact this office.
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
## 2017-07-13 ENCOUNTER — Telehealth: Payer: Self-pay | Admitting: Physician Assistant

## 2017-07-13 ENCOUNTER — Ambulatory Visit: Payer: Self-pay | Admitting: *Deleted

## 2017-07-13 NOTE — Telephone Encounter (Signed)
Ms. Birder RobsonBright is requesting a different pain medicine. The Percocet is not working and also the muscle relaxer. Please advise.

## 2017-07-15 MED ORDER — PREDNISONE 20 MG PO TABS: ORAL_TABLET | ORAL | 0 refills | Status: AC

## 2017-07-15 NOTE — Addendum Note (Signed)
Addended by: Fernande BrasJEFFERY, Murlene Revell S on: 07/15/2017 10:22 AM   Modules accepted: Orders

## 2017-07-15 NOTE — Telephone Encounter (Signed)
Error-Close Encounter 

## 2017-07-15 NOTE — Telephone Encounter (Signed)
I am so sorry to hear that the oxycodone and cyclobenzaprine are not helping.  We can add prednisone. If that is ineffective, she's going to need to go to an urgent care or ED near her.  Meds ordered this encounter  Medications  . predniSONE (DELTASONE) 20 MG tablet    Sig: Take 3 PO QAM x3days, 2 PO QAM x3days, 1 PO QAM x3days    Dispense:  18 tablet    Refill:  0    Order Specific Question:   Supervising Provider    Answer:   Clelia CroftSHAW, EVA N [[id number]]

## 2017-07-23 ENCOUNTER — Telehealth: Payer: Self-pay | Admitting: Physician Assistant

## 2017-07-23 NOTE — Telephone Encounter (Signed)
Copied from CRM [id number]. Topic: Quick Communication - See Telephone Encounter >> Jul 23, 2017  9:59 AM Floria RavelingStovall, Shana A wrote: CRM for notification. See Telephone encounter for: pt called in said that she is not going feeling any better .  She said that she is not taking the prednisone. Oxycodone is not working.  She wants something stronger for pain.  She said that she is using xanex to sleep.  She kept saying that now she has poof that she has back pain because the xray showed.  She just keep saying that she need something stronger for the pain and not will to take the prednisone to help fix it    07/23/17.

## 2017-07-23 NOTE — Telephone Encounter (Signed)
Noted  

## 2017-07-23 NOTE — Telephone Encounter (Signed)
Chelle    called patient to ask if there was a problem with prednisone , she did not want to answer me said she will just wait to see her doctor and use xanax to sleep then hung up.

## 2017-08-12 ENCOUNTER — Telehealth: Payer: Self-pay

## 2017-08-12 NOTE — Telephone Encounter (Signed)
Letter put in envelope and will be mailed.

## 2017-08-12 NOTE — Telephone Encounter (Signed)
Letter printed.  Please send to patient as requested.

## 2017-08-12 NOTE — Telephone Encounter (Signed)
Copied from CRM [id number]. Topic: General - Other >> Aug 11, 2017  5:06 PM Stephannie LiSimmons, Janett L, NT wrote: Reason for CRM: Patient needs a letter that  states the importance her  needing  to see a specialist  for neck and back ,  she needs a detailed letter with medical diagnosis for her neck and back , she says she is pretty much bed ridden please call  [phone], to advise  the letter is for her dad  so he will help get her truck fixed ,please write the letter to whom it may concern would like this right a way ,and thank you so much.

## 2017-11-09 ENCOUNTER — Encounter: Payer: Self-pay | Admitting: Physician Assistant

## 2017-11-15 ENCOUNTER — Other Ambulatory Visit: Payer: Self-pay | Admitting: Physician Assistant

## 2017-11-15 DIAGNOSIS — R110 Nausea: Secondary | ICD-10-CM

## 2017-11-15 DIAGNOSIS — M545 Low back pain, unspecified: Secondary | ICD-10-CM

## 2017-11-15 DIAGNOSIS — M542 Cervicalgia: Secondary | ICD-10-CM

## 2017-11-15 DIAGNOSIS — M898X1 Other specified disorders of bone, shoulder: Secondary | ICD-10-CM

## 2017-11-15 DIAGNOSIS — G8929 Other chronic pain: Secondary | ICD-10-CM

## 2018-08-17 ENCOUNTER — Other Ambulatory Visit: Payer: Self-pay | Admitting: General Practice

## 2018-08-17 NOTE — Telephone Encounter (Signed)
Patient is calling to reiterate that he is out of the spiranolactone.

## 2018-08-17 NOTE — Telephone Encounter (Unsigned)
Copied from CRM [id number]. Topic: Quick Communication - Rx Refill/Question >> Aug 17, 2018  1:52 PM Anita Sparks wrote: Medication: spironolactone (ALDACTONE) 100 MG tablet (2 a day)  Pt states he has moved to [place] and does not have the money for a dr right now.  But was able to schedule appt on 2//27/2020.  But needs this med for his t blockers. Pt would like a rx to get him thourgh to his appt on 09/30/2018. Pt had a problem finding someone to take transgender. [id number] - [place], [place] - [place] AT [facility] OF [facility] [address] [phone] (Phone) [phone] (Fax)

## 2018-08-18 NOTE — Telephone Encounter (Signed)
Pt also needs a refill on estradiol cypionate (DEPO-ESTRADIOL) 5 MG/ML injection  And   22 gauge 3ml / inch and half syringes for estradiol injections   Pt has moved to [place] and is having a hard time finding a provider that is willing to take her on as a patient. She has tried 4 providers and was turned away. Pt wants to know if office has any referrals to any providers in her area that she can establish care with.    [facility][id number] - [place] - [place].[address] AT [facility] [address] [phone] (Phone) [phone] (Fax)

## 2018-08-20 NOTE — Telephone Encounter (Signed)
Please advise 

## 2018-08-27 NOTE — Telephone Encounter (Signed)
Pt is calling requested a refill on Spironolactone (Aldactone) 100 mg bid. I spoke with the patient to advise that the last office visit was in 2018 with Theora Gianotti, will need appointment to establish with a new provider. Pt stated that she is now living in [place] and can't find a doctor that will  take on as a patient. I explained that I will send the message, but stressed that she has not been seen here by a new provider. Pt would like rx sent to [place], [place], [place]. She didn't know telephone number or address to pharmacy, she stated that this is the only [facility]. Please advise. thanks

## 2018-08-27 NOTE — Telephone Encounter (Signed)
Please call back I am sorry about it is not ok to refill medications, any medications, without a patient-provider relationship, which as this point does not exist Now I was doing some research regarding transgender care in [place]. There is not much at all, I can see why she is struggling However there are several options in [place], which is way closer to her (about an hour and a half) then [facility] and therefore it seems to me a more permament solution. If she has already looked into these then I am sorry, just trying to help her locate appropriate providers.  There is [facility] which specifically takes about transgender care and has a patient assistance program.  [facility] [address] [place], [place] [address] Monday - Thursday 8 a.m. - 6 p.m.         Friday 8 a.m. - 2 p.m.          Saturday 8 a.m. - 2 p.m. [phone] [facility] [address] [place], [place] [address] Monday - Thursday  8 a.m. - 5 p.m.        Friday  8 a.m. - 2 p.m. [phone]  There is also [facility], located at [address] ph [phone]  I hope these resources are of benefit to her.  Thanks

## 2018-08-27 NOTE — Telephone Encounter (Signed)
Pt calling to check status on medication refill. Pt requesting a call back to discuss. [phone]

## 2018-09-03 NOTE — Telephone Encounter (Signed)
Spoke with pt about possible locations she can establish care in [place]. She verbalized understand.

## 2018-09-03 NOTE — Telephone Encounter (Signed)
Letter sent.
